# Patient Record
Sex: Female | Born: 1980
Health system: Southern US, Community
[De-identification: ages and names within clinical notes are randomized; demographics above are authoritative.]

## PROBLEM LIST (undated history)

## (undated) DIAGNOSIS — N979 Female infertility, unspecified: Secondary | ICD-10-CM

## (undated) DIAGNOSIS — R1110 Vomiting, unspecified: Secondary | ICD-10-CM

## (undated) DIAGNOSIS — R51 Headache: Secondary | ICD-10-CM

## (undated) HISTORY — DX: Female infertility, unspecified: N97.9

## (undated) HISTORY — DX: Headache: R51

## (undated) HISTORY — DX: Vomiting, unspecified: R11.10

## (undated) HISTORY — PX: WISDOM TOOTH EXTRACTION: SHX21

---

## 2004-02-05 ENCOUNTER — Other Ambulatory Visit: Admission: RE | Admit: 2004-02-05 | Discharge: 2004-02-05 | Payer: Self-pay | Admitting: Obstetrics and Gynecology

## 2004-05-02 ENCOUNTER — Emergency Department (HOSPITAL_COMMUNITY): Admission: EM | Admit: 2004-05-02 | Discharge: 2004-05-02 | Payer: Self-pay | Admitting: Family Medicine

## 2005-01-11 ENCOUNTER — Encounter: Admission: RE | Admit: 2005-01-11 | Discharge: 2005-01-11 | Payer: Self-pay | Admitting: Family Medicine

## 2007-03-28 ENCOUNTER — Emergency Department (HOSPITAL_COMMUNITY): Admission: EM | Admit: 2007-03-28 | Discharge: 2007-03-28 | Payer: Self-pay | Admitting: Emergency Medicine

## 2007-06-19 ENCOUNTER — Inpatient Hospital Stay (HOSPITAL_COMMUNITY): Admission: AD | Admit: 2007-06-19 | Discharge: 2007-06-22 | Payer: Self-pay | Admitting: Obstetrics and Gynecology

## 2007-06-28 ENCOUNTER — Ambulatory Visit: Payer: Self-pay | Admitting: Gastroenterology

## 2007-08-16 ENCOUNTER — Inpatient Hospital Stay (HOSPITAL_COMMUNITY): Admission: AD | Admit: 2007-08-16 | Discharge: 2007-08-16 | Payer: Self-pay | Admitting: Obstetrics and Gynecology

## 2007-12-23 ENCOUNTER — Inpatient Hospital Stay (HOSPITAL_COMMUNITY): Admission: AD | Admit: 2007-12-23 | Discharge: 2007-12-23 | Payer: Self-pay | Admitting: Obstetrics and Gynecology

## 2007-12-25 ENCOUNTER — Inpatient Hospital Stay (HOSPITAL_COMMUNITY): Admission: AD | Admit: 2007-12-25 | Discharge: 2007-12-27 | Payer: Self-pay | Admitting: Obstetrics and Gynecology

## 2008-10-28 IMAGING — US US ABDOMEN COMPLETE
1 series · 14 of 25 positions shown · non-contrast
Comparison: None

CLINICAL DATA: Severe nausea and vomiting.  Abdominal pain.  12
weeks pregnant..

COMPLETE ABDOMINAL ULTRASOUND
TECHNIQUE: Complete abdominal ultrasound examination was performed
including evaluation of the liver, gallbladder, bile ducts,
pancreas, kidneys, spleen, IVC, and abdominal aorta.

[Series 1: us abdomen complete · 0.23mm/px · 14 of 66 slices shown]
[im 1/66]
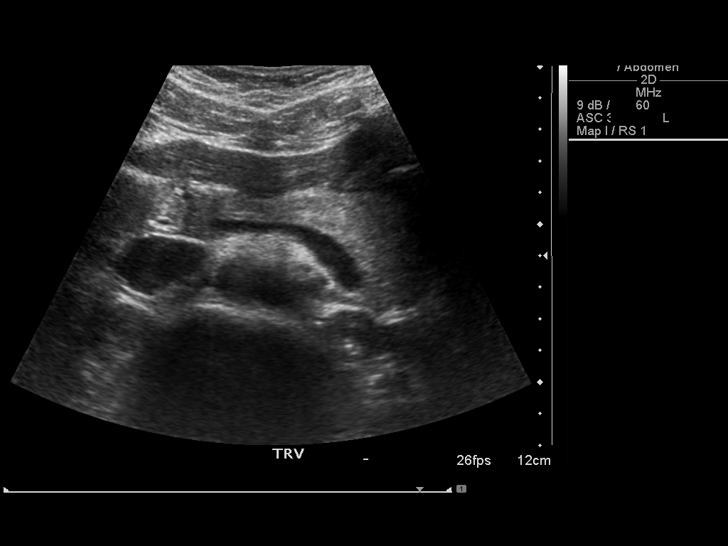
[im 6/66]
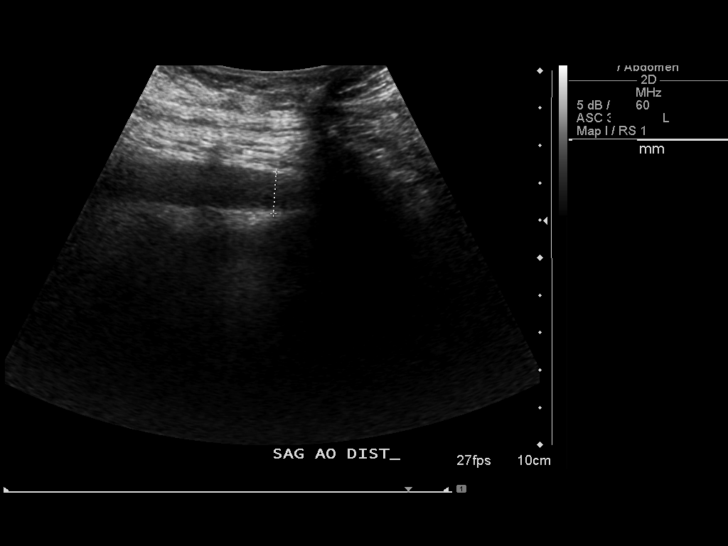
[im 11/66]
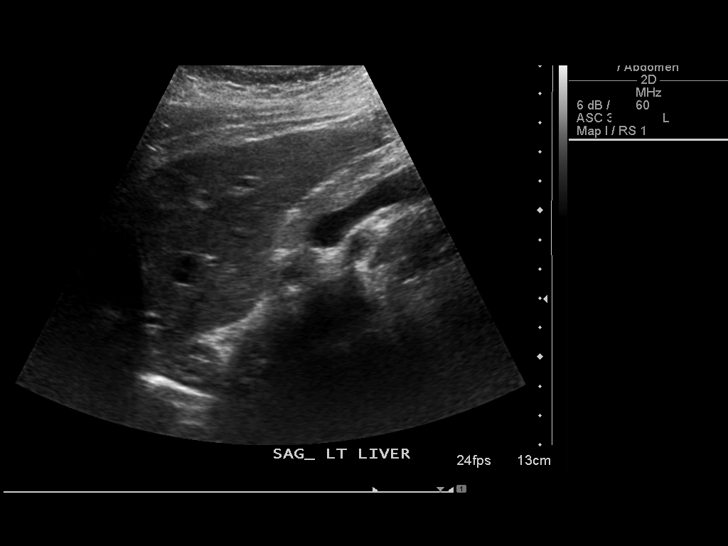
[im 17/66]
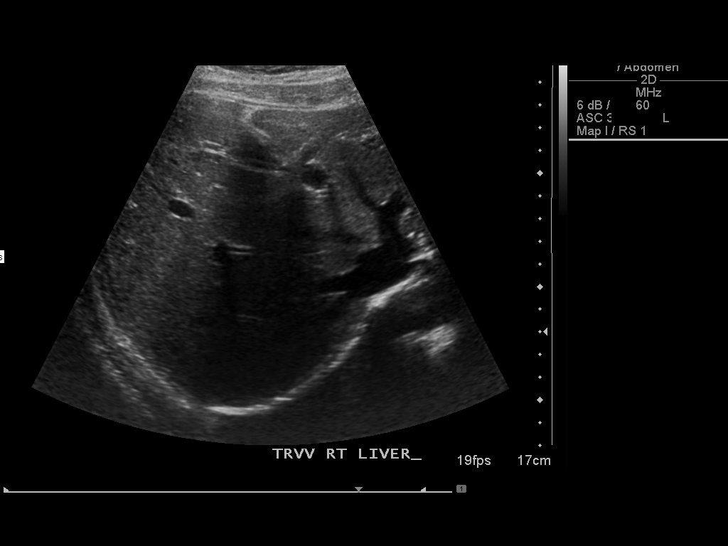
[im 22/66]
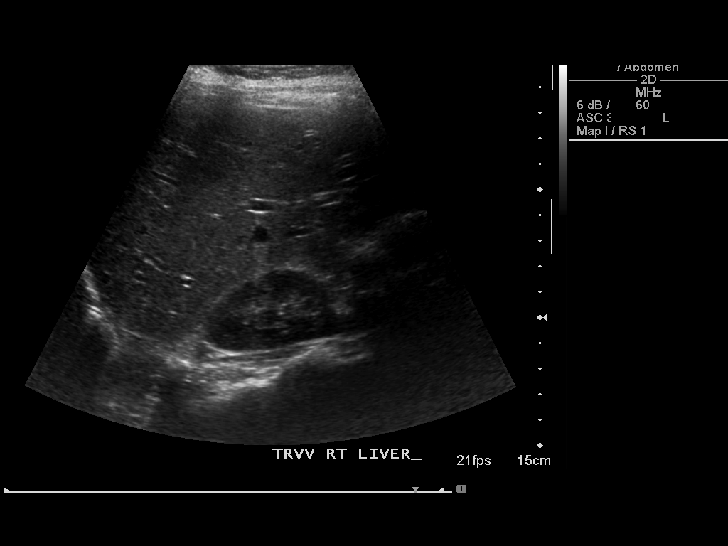
[im 25/66]
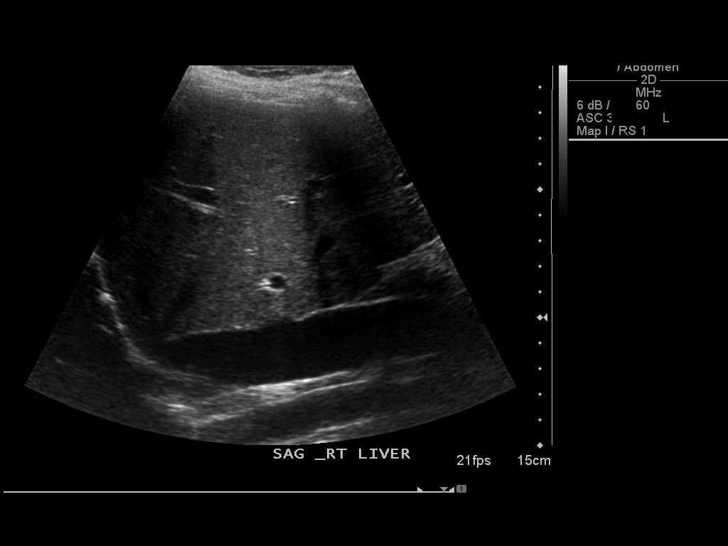
[im 30/66]
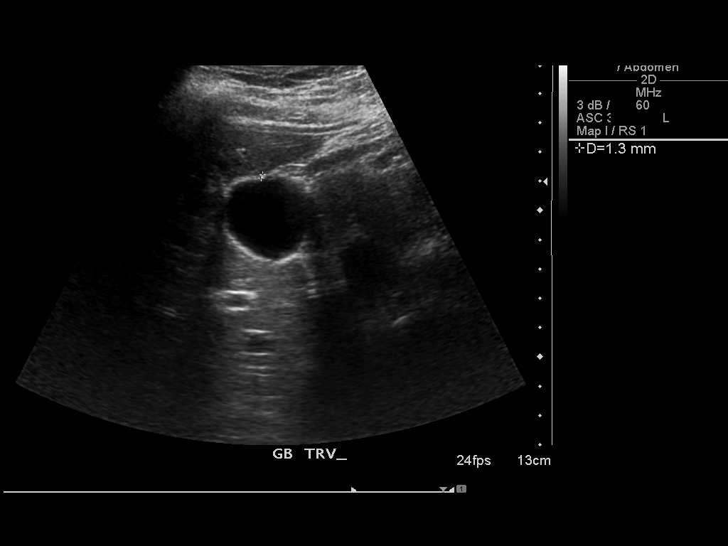
[im 36/66]
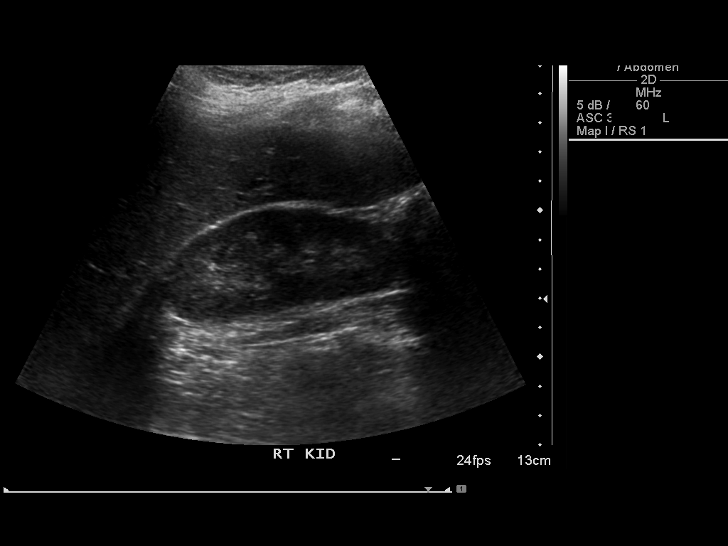
[im 41/66]
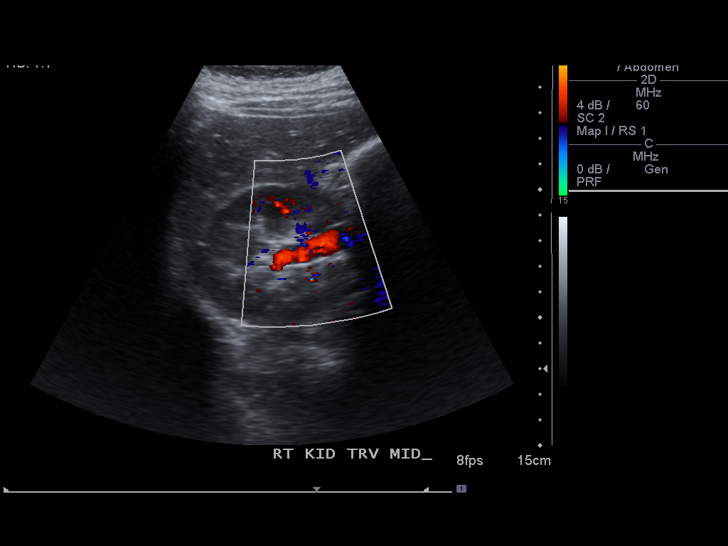
[im 44/66]
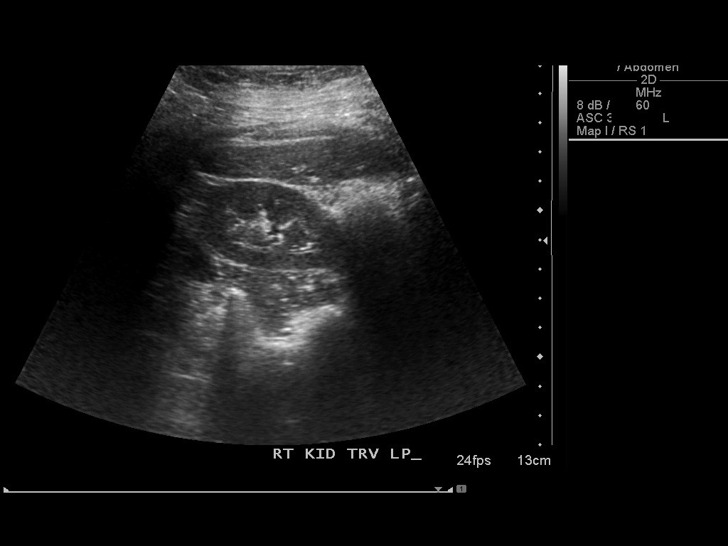
[im 49/66]
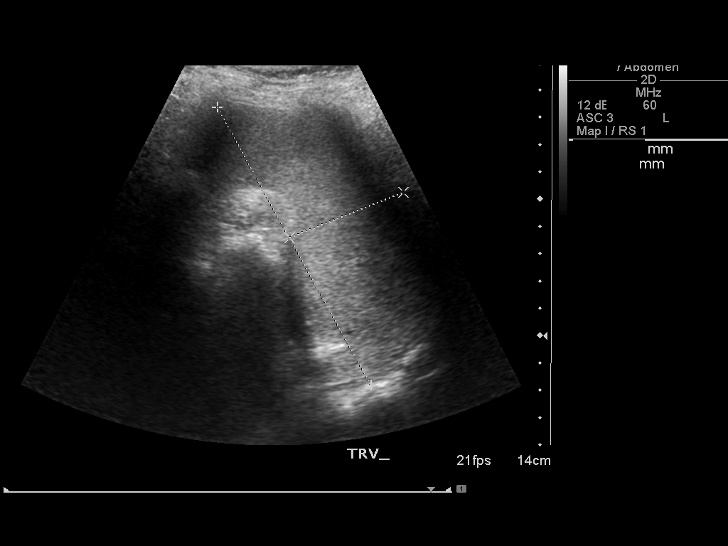
[im 55/66]
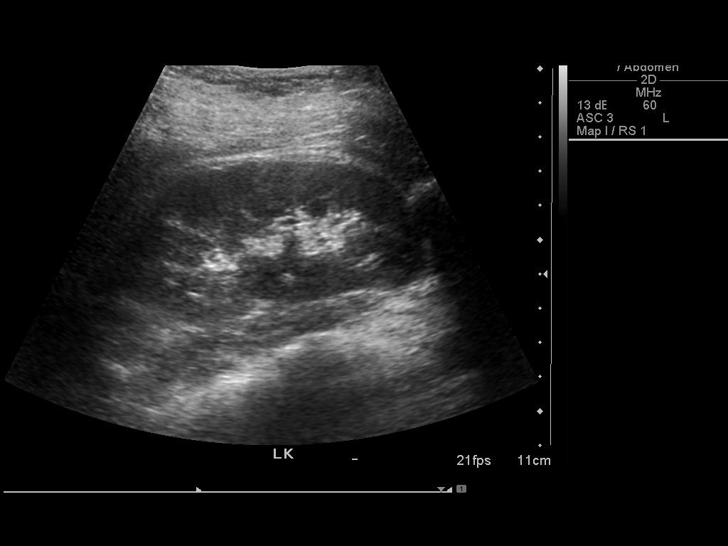
[im 60/66]
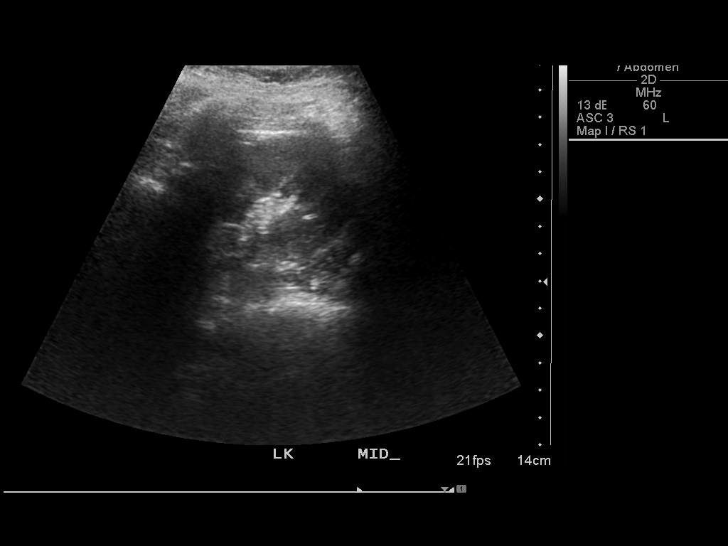
[im 66/66]
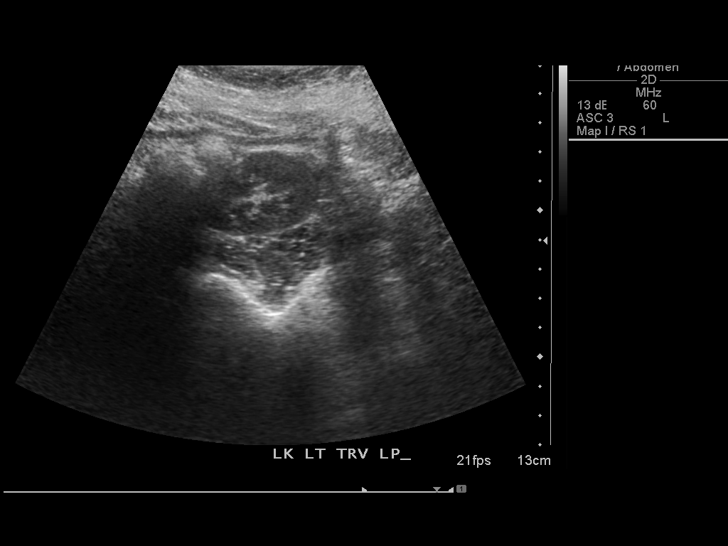

[14 of 25 positions shown; findings below may reference images not displayed]

FINDINGS: Gallbladder:  No gallstones, gallbladder wall thickening, or
pericholecystic fluid.

Common bile duct: Within normal limits in caliber.

Liver:  No focal parenchymal abnormalities.  Within normal limits
in parenchymal echogenicity.

Inferior vena cava:  Visualized portion unremarkable.

Pancreas:  Visualized portion unremarkable.

Spleen:  Within normal limits in size and echogenicity.

Right kidney:  Within normal limits in size and echogenicity. No
evidence of mass or hydronephrosis.

Left kidney:  Within normal limits in size and echogenicity. No
evidence of mass or hydronephrosis.

Abdominal aorta:  Within normal limits in caliber.
IMPRESSION: Negative abdominal ultrasound.

## 2010-06-22 NOTE — Discharge Summary (Signed)
NAME:  Carolyn Cervantes, Carolyn Cervantes               ACCOUNT NO.:  [id number]   MEDICAL RECORD NO.:  [id number]          PATIENT TYPE:  INP   LOCATION:  9309                          FACILITY:  [facility]   PHYSICIAN:  Juluis Mire, M.D.   DATE OF BIRTH:  05-25-1980   DATE OF ADMISSION:  06/19/2007  DATE OF DISCHARGE:  06/22/2007                               DISCHARGE SUMMARY   ADMITTING DIAGNOSIS:  Intrauterine pregnancy at 12 weeks with  hyperemesis.   DISCHARGE DIAGNOSIS:  Intrauterine pregnancy at 12 weeks with  hyperemesis.   For complete history and physical, please see written note.   COURSE IN THE HOSPITAL:  The patient was brought and began IV hydration  and antiemetics.  Because of the persistent nausea and vomiting, an  ultrasound of the gallbladder was obtained.  There was no evidence of  any cholelithiasis.  She did have an elevated amylase.  A GI consult was  obtained.  They thought the amylase was just from the hyperemesis.  She  was finally switched to a Zofran pump, did extremely well with that.  At  the time of discharge, she was tolerated the diet and having minimal  nausea or vomiting at that point in time.  Additionally, she had  undergone a course of steroids for the hyperemesis.  At the time of  discharge, she was afebrile with stable vital signs.  Abdomen was soft  and nontender.  The patient is discharged to home in stable condition.   DISPOSITION:  She will continue on Zofran pump at home.  She will follow  up in the office next week for reevaluation.  She will call for  increasing nausea and vomiting.      Juluis Mire, M.D.  Electronically Signed     JSM/MEDQ  D:  06/22/2007  T:  06/22/2007  Job:  [id number]

## 2010-10-29 LAB — CBC
HCT: 36.2
Hemoglobin: 12.4
MCHC: 34.4
MCV: 87.2
Platelets: 141 — ABNORMAL LOW
RBC: 4.15
RDW: 13.3
WBC: 4.5

## 2010-10-29 LAB — DIFFERENTIAL
Basophils Absolute: 0
Basophils Relative: 0
Eosinophils Absolute: 0.1
Eosinophils Relative: 2
Lymphocytes Relative: 26
Lymphs Abs: 1.2
Monocytes Absolute: 0.5
Monocytes Relative: 12
Neutro Abs: 2.7
Neutrophils Relative %: 60

## 2010-10-29 LAB — HEPATIC FUNCTION PANEL
ALT: 14
AST: 18
Albumin: 3.9
Alkaline Phosphatase: 38 — ABNORMAL LOW
Bilirubin, Direct: 0.1
Indirect Bilirubin: 0.6
Total Bilirubin: 0.7
Total Protein: 6.5

## 2010-10-29 LAB — POCT PREGNANCY, URINE: Preg Test, Ur: NEGATIVE

## 2010-11-09 LAB — CBC
HCT: 28.5 — ABNORMAL LOW
Hemoglobin: 7.6 — CL
MCV: 83
Platelets: 188
Platelets: 213
RBC: 2.77 — ABNORMAL LOW
RDW: 14.6
RDW: 14.8
WBC: 8.6

## 2011-05-12 LAB — OB RESULTS CONSOLE ANTIBODY SCREEN: Antibody Screen: NEGATIVE

## 2011-05-12 LAB — OB RESULTS CONSOLE RUBELLA ANTIBODY, IGM: Rubella: IMMUNE

## 2011-05-12 LAB — OB RESULTS CONSOLE GC/CHLAMYDIA
Chlamydia: NEGATIVE
Gonorrhea: NEGATIVE

## 2011-05-12 LAB — OB RESULTS CONSOLE HIV ANTIBODY (ROUTINE TESTING): HIV: NONREACTIVE

## 2011-05-12 LAB — OB RESULTS CONSOLE RPR: RPR: NONREACTIVE

## 2011-12-09 ENCOUNTER — Inpatient Hospital Stay (HOSPITAL_COMMUNITY): Admission: AD | Admit: 2011-12-09 | Payer: Self-pay | Source: Ambulatory Visit | Admitting: Obstetrics and Gynecology

## 2011-12-15 ENCOUNTER — Encounter (HOSPITAL_COMMUNITY): Payer: Self-pay | Admitting: *Deleted

## 2011-12-15 ENCOUNTER — Telehealth (HOSPITAL_COMMUNITY): Payer: Self-pay | Admitting: *Deleted

## 2011-12-15 NOTE — Telephone Encounter (Signed)
Preadmission screen Pt stated she has several quarter size spots on her pad. Membranes stripped yesterday.  Told pt to notify MD office.  Reports good fetal movement no leaking of fluid and no contractions at present.  Spoke with Amy RN at [facility] and updated to phone conversation.

## 2011-12-15 NOTE — Telephone Encounter (Signed)
Preadmission screen  

## 2011-12-19 ENCOUNTER — Inpatient Hospital Stay (HOSPITAL_COMMUNITY)
Admission: RE | Admit: 2011-12-19 | Discharge: 2011-12-21 | DRG: 775 | Disposition: A | Discharge status: Home / Self Care | Payer: Managed Care, Other (non HMO) | Source: Ambulatory Visit | Attending: Obstetrics and Gynecology | Admitting: Obstetrics and Gynecology

## 2011-12-19 ENCOUNTER — Inpatient Hospital Stay (HOSPITAL_COMMUNITY): Payer: Managed Care, Other (non HMO) | Admitting: Anesthesiology

## 2011-12-19 ENCOUNTER — Encounter (HOSPITAL_COMMUNITY): Payer: Self-pay

## 2011-12-19 ENCOUNTER — Encounter (HOSPITAL_COMMUNITY): Payer: Self-pay | Admitting: Anesthesiology

## 2011-12-19 DIAGNOSIS — R51 Headache: Secondary | ICD-10-CM | POA: Diagnosis not present

## 2011-12-19 DIAGNOSIS — O99893 Other specified diseases and conditions complicating puerperium: Secondary | ICD-10-CM | POA: Diagnosis not present

## 2011-12-19 LAB — CBC
HCT: 34.4 % — ABNORMAL LOW (ref 36.0–46.0)
Hemoglobin: 11.8 g/dL — ABNORMAL LOW (ref 12.0–15.0)
MCHC: 34.3 g/dL (ref 30.0–36.0)
RBC: 3.84 MIL/uL — ABNORMAL LOW (ref 3.87–5.11)

## 2011-12-19 LAB — RPR: RPR Ser Ql: NONREACTIVE

## 2011-12-19 MED ORDER — ONDANSETRON HCL 4 MG PO TABS: 4.0000 mg | ORAL_TABLET | ORAL | Status: DC | PRN

## 2011-12-19 MED ORDER — MEASLES, MUMPS & RUBELLA VAC ~~LOC~~ INJ: 0.5000 mL | INJECTION | Freq: Once | SUBCUTANEOUS | Status: DC

## 2011-12-19 MED ORDER — LACTATED RINGERS IV SOLN
500.0000 mL | INTRAVENOUS | Status: DC | PRN
  Administered 2011-12-19: 500 mL via INTRAVENOUS

## 2011-12-19 MED ORDER — PRENATAL MULTIVITAMIN CH
1.0000 | ORAL_TABLET | Freq: Every day | ORAL | Status: DC
  Administered 2011-12-20 – 2011-12-21 (×2): 1 via ORAL
  Filled 2011-12-19 (×2): qty 1

## 2011-12-19 MED ORDER — SIMETHICONE 80 MG PO CHEW: 80.0000 mg | CHEWABLE_TABLET | ORAL | Status: DC | PRN

## 2011-12-19 MED ORDER — LACTATED RINGERS IV SOLN
INTRAVENOUS | Status: DC
  Administered 2011-12-19 (×2): via INTRAVENOUS

## 2011-12-19 MED ORDER — LACTATED RINGERS IV SOLN
500.0000 mL | Freq: Once | INTRAVENOUS | Status: AC
  Administered 2011-12-19: 500 mL via INTRAVENOUS

## 2011-12-19 MED ORDER — LIDOCAINE HCL (PF) 1 % IJ SOLN
30.0000 mL | INTRAMUSCULAR | Status: DC | PRN
  Filled 2011-12-19: qty 30

## 2011-12-19 MED ORDER — ONDANSETRON HCL 4 MG/2ML IJ SOLN
4.0000 mg | Freq: Four times a day (QID) | INTRAMUSCULAR | Status: DC | PRN
  Administered 2011-12-19: 4 mg via INTRAVENOUS
  Filled 2011-12-19: qty 2

## 2011-12-19 MED ORDER — IBUPROFEN 600 MG PO TABS
600.0000 mg | ORAL_TABLET | Freq: Four times a day (QID) | ORAL | Status: DC
  Administered 2011-12-20 – 2011-12-21 (×6): 600 mg via ORAL
  Filled 2011-12-19 (×7): qty 1

## 2011-12-19 MED ORDER — OXYTOCIN BOLUS FROM INFUSION: 500.0000 mL | INTRAVENOUS | Status: DC

## 2011-12-19 MED ORDER — DIPHENHYDRAMINE HCL 25 MG PO CAPS: 25.0000 mg | ORAL_CAPSULE | Freq: Four times a day (QID) | ORAL | Status: DC | PRN

## 2011-12-19 MED ORDER — DIBUCAINE 1 % RE OINT: 1.0000 "application " | TOPICAL_OINTMENT | RECTAL | Status: DC | PRN

## 2011-12-19 MED ORDER — TERBUTALINE SULFATE 1 MG/ML IJ SOLN: 0.2500 mg | Freq: Once | INTRAMUSCULAR | Status: DC | PRN

## 2011-12-19 MED ORDER — FENTANYL 2.5 MCG/ML BUPIVACAINE 1/10 % EPIDURAL INFUSION (WH - ANES)
14.0000 mL/h | INTRAMUSCULAR | Status: DC
  Administered 2011-12-19: 14 mL/h via EPIDURAL
  Filled 2011-12-19: qty 125

## 2011-12-19 MED ORDER — ACETAMINOPHEN 325 MG PO TABS: 650.0000 mg | ORAL_TABLET | ORAL | Status: DC | PRN

## 2011-12-19 MED ORDER — WITCH HAZEL-GLYCERIN EX PADS: 1.0000 "application " | MEDICATED_PAD | CUTANEOUS | Status: DC | PRN

## 2011-12-19 MED ORDER — BENZOCAINE-MENTHOL 20-0.5 % EX AERO
1.0000 "application " | INHALATION_SPRAY | CUTANEOUS | Status: DC | PRN
  Filled 2011-12-19: qty 56

## 2011-12-19 MED ORDER — PHENYLEPHRINE 40 MCG/ML (10ML) SYRINGE FOR IV PUSH (FOR BLOOD PRESSURE SUPPORT)
80.0000 ug | PREFILLED_SYRINGE | INTRAVENOUS | Status: DC | PRN
  Filled 2011-12-19: qty 5

## 2011-12-19 MED ORDER — LANOLIN HYDROUS EX OINT: TOPICAL_OINTMENT | CUTANEOUS | Status: DC | PRN

## 2011-12-19 MED ORDER — MEDROXYPROGESTERONE ACETATE 150 MG/ML IM SUSP: 150.0000 mg | INTRAMUSCULAR | Status: DC | PRN

## 2011-12-19 MED ORDER — DIPHENHYDRAMINE HCL 50 MG/ML IJ SOLN: 12.5000 mg | INTRAMUSCULAR | Status: DC | PRN

## 2011-12-19 MED ORDER — EPHEDRINE 5 MG/ML INJ
10.0000 mg | INTRAVENOUS | Status: DC | PRN
  Filled 2011-12-19: qty 4

## 2011-12-19 MED ORDER — EPHEDRINE 5 MG/ML INJ: 10.0000 mg | INTRAVENOUS | Status: DC | PRN

## 2011-12-19 MED ORDER — LIDOCAINE HCL (PF) 1 % IJ SOLN
INTRAMUSCULAR | Status: DC | PRN
  Administered 2011-12-19 (×2): 5 mL

## 2011-12-19 MED ORDER — IBUPROFEN 600 MG PO TABS: 600.0000 mg | ORAL_TABLET | Freq: Four times a day (QID) | ORAL | Status: DC | PRN

## 2011-12-19 MED ORDER — PHENYLEPHRINE 40 MCG/ML (10ML) SYRINGE FOR IV PUSH (FOR BLOOD PRESSURE SUPPORT): 80.0000 ug | PREFILLED_SYRINGE | INTRAVENOUS | Status: DC | PRN

## 2011-12-19 MED ORDER — SENNOSIDES-DOCUSATE SODIUM 8.6-50 MG PO TABS
2.0000 | ORAL_TABLET | Freq: Every day | ORAL | Status: DC
  Administered 2011-12-20: 2 via ORAL

## 2011-12-19 MED ORDER — TETANUS-DIPHTH-ACELL PERTUSSIS 5-2.5-18.5 LF-MCG/0.5 IM SUSP: 0.5000 mL | Freq: Once | INTRAMUSCULAR | Status: DC

## 2011-12-19 MED ORDER — OXYTOCIN 40 UNITS IN LACTATED RINGERS INFUSION - SIMPLE MED
1.0000 m[IU]/min | INTRAVENOUS | Status: DC
  Administered 2011-12-19: 10 m[IU]/min via INTRAVENOUS
  Administered 2011-12-19: 2 m[IU]/min via INTRAVENOUS
  Filled 2011-12-19: qty 1000

## 2011-12-19 MED ORDER — OXYTOCIN 40 UNITS IN LACTATED RINGERS INFUSION - SIMPLE MED
62.5000 mL/h | INTRAVENOUS | Status: DC
  Administered 2011-12-19: 62.5 mL/h via INTRAVENOUS

## 2011-12-19 MED ORDER — CITRIC ACID-SODIUM CITRATE 334-500 MG/5ML PO SOLN: 30.0000 mL | ORAL | Status: DC | PRN

## 2011-12-19 MED ORDER — ONDANSETRON HCL 4 MG/2ML IJ SOLN: 4.0000 mg | INTRAMUSCULAR | Status: DC | PRN

## 2011-12-19 NOTE — Progress Notes (Signed)
Pt. Continues to talk to staff and color not as pale. PO fluids taken

## 2011-12-19 NOTE — Anesthesia Procedure Notes (Signed)
Epidural Patient location during procedure: OB Start time: 12/19/2011 12:29 PM  Staffing Anesthesiologist: Brayton Caves R Performed by: anesthesiologist   Preanesthetic Checklist Completed: patient identified, site marked, surgical consent, pre-op evaluation, timeout performed, IV checked, risks and benefits discussed and monitors and equipment checked  Epidural Patient position: sitting Prep: site prepped and draped and DuraPrep Patient monitoring: continuous pulse ox and blood pressure Approach: midline Injection technique: LOR air and LOR saline  Needle:  Needle type: Tuohy  Needle gauge: 17 G Needle length: 9 cm and 9 Needle insertion depth: 5 cm cm Catheter type: closed end flexible Catheter size: 19 Gauge Catheter at skin depth: 10 cm Test dose: negative  Assessment Events: blood not aspirated, injection not painful, no injection resistance, negative IV test and no paresthesia  Additional Notes Patient identified.  Risk benefits discussed including failed block, incomplete pain control, headache, nerve damage, paralysis, blood pressure changes, nausea, vomiting, reactions to medication both toxic or allergic, and postpartum back pain.  Patient expressed understanding and wished to proceed.  All questions were answered.  Sterile technique used throughout procedure and epidural site dressed with sterile barrier dressing. No paresthesia or other complications noted.The patient did not experience any signs of intravascular injection such as tinnitus or metallic taste in mouth nor signs of intrathecal spread such as rapid motor block. Please see nursing notes for vital signs.

## 2011-12-19 NOTE — Progress Notes (Signed)
Pt comfortable w/ epidural.  FHT reassuring Toco Q2 Cvx 4/70/-1  A/P:  Continue w/ Pitocin Exp mngt

## 2011-12-19 NOTE — Progress Notes (Deleted)
Pt back from MRI at [facility].  Reports frontal HA resolved, having some HA in back of head and back stiffness from MRI.  No visual changes or N/V.    AF, VSS  BP 140s/80s Gen - NAD Abd - soft, NT  MR results d/w pt.  No evidence of thrombosis or bleed.  Overall results normal.    A/P:  Plan d/c home with f/u in 1-2 days in office.

## 2011-12-19 NOTE — Progress Notes (Signed)
Pt feeling low pressure and cramping  FHT reassuring Toco Q2 Cvx c/c/+2  A/P:  Will start pushing

## 2011-12-19 NOTE — Anesthesia Preprocedure Evaluation (Signed)
Anesthesia Evaluation  Patient identified by MRN, date of birth, ID band Patient awake    Reviewed: Allergy & Precautions, H&P , Patient's Chart, lab work & pertinent test results  Airway Mallampati: II TM Distance: >3 FB Neck ROM: full    Dental No notable dental hx.    Pulmonary neg pulmonary ROS,  breath sounds clear to auscultation  Pulmonary exam normal       Cardiovascular negative cardio ROS  Rhythm:regular Rate:Normal     Neuro/Psych negative neurological ROS  negative psych ROS   GI/Hepatic negative GI ROS, Neg liver ROS,   Endo/Other  negative endocrine ROS  Renal/GU negative Renal ROS     Musculoskeletal   Abdominal   Peds  Hematology negative hematology ROS (+)   Anesthesia Other Findings Infertility, female   clomid Headache   migraines    Hyperemesis          Reproductive/Obstetrics (+) Pregnancy                           Anesthesia Physical Anesthesia Plan  ASA: II  Anesthesia Plan: Epidural   Post-op Pain Management:    Induction:   Airway Management Planned:   Additional Equipment:   Intra-op Plan:   Post-operative Plan:   Informed Consent: I have reviewed the patients History and Physical, chart, labs and discussed the procedure including the risks, benefits and alternatives for the proposed anesthesia with the patient or authorized representative who has indicated his/her understanding and acceptance.     Plan Discussed with:   Anesthesia Plan Comments:         Anesthesia Quick Evaluation

## 2011-12-19 NOTE — Progress Notes (Signed)
SVD of vigerous female infant w/ apgars of 8,9.  Mild right shoulder dystocia (<15sec) resolved with McRoberts. Placenta delivered spontaneous w/ 3VC.   2nd degree lac repaired w/ 3-0 vicryl.  Fundus firm.  EBL 500cc .  Mom and baby stable in LDR, skin to skin

## 2011-12-19 NOTE — H&P (Signed)
31 yo G4P1 @ 39+5 wks presents for elective IOL.  Pt with worsening back pain and pelvic pressure. Counseled by Dr Rana Snare in the office and scheduled for IOL.   No ctx, vb or lof.  Good FM.  Pregnancy uncomplicated.  Past History - see hollister, GBS -   All:  Codeine  AF, VSS  + FHT  Toco - irregular Gen - NAD Abd - gravid, NT Ext - NT, no edema PV 2-3/50/-2 AROM - clear  A/P:  Pt presents for elective IOL Admit Pitocin induction Risks of c-section, hyperstimulation and fetal distress with pitocin discussed, informed consent

## 2011-12-19 NOTE — Progress Notes (Signed)
Assisted up to bathroom with steady. As going to bathroom pt became lightheaded and pale. Pt remains on steady and assisted back to bed with head of bed low. Pt continued to talk to nurse.

## 2011-12-20 LAB — CBC
MCV: 89.5 fL (ref 78.0–100.0)
Platelets: 149 10*3/uL — ABNORMAL LOW (ref 150–400)
RBC: 3.05 MIL/uL — ABNORMAL LOW (ref 3.87–5.11)
RDW: 14 % (ref 11.5–15.5)
WBC: 14.5 10*3/uL — ABNORMAL HIGH (ref 4.0–10.5)

## 2011-12-20 MED ORDER — TRAMADOL HCL 50 MG PO TABS: 50.0000 mg | ORAL_TABLET | Freq: Four times a day (QID) | ORAL | Status: DC | PRN

## 2011-12-20 NOTE — Progress Notes (Signed)
Post Partum Day 1 Subjective: no complaints, up ad lib, voiding, tolerating PO and + flatus. Denies any dizziness this am  Objective: Blood pressure 100/65, pulse 89, temperature 97.9 F (36.6 C), temperature source Oral, resp. rate 18, height 5\' 4"  (1.626 m), weight 81.647 kg (180 lb), last menstrual period 03/16/2011, SpO2 97.00%, unknown if currently breastfeeding.  Physical Exam:  General: alert and cooperative Lochia: appropriate Uterine Fundus: firm Incision: perineum intact DVT Evaluation: No evidence of DVT seen on physical exam. No significant calf/ankle edema.   Basename 12/20/11 0535 12/19/11 0745  HGB 9.4* 11.8*  HCT 27.3* 34.4*    Assessment/Plan: Plan for discharge tomorrow   LOS: 1 day   Mance Vallejo G 12/20/2011, 8:10 AM

## 2011-12-20 NOTE — Anesthesia Postprocedure Evaluation (Signed)
  Anesthesia Post-op Note  Patient: Carolyn Cervantes  Procedure(s) Performed: * No procedures listed *  Patient Location: Mother/Baby  Anesthesia Type:Epidural  Level of Consciousness: awake, alert  and oriented  Airway and Oxygen Therapy: Patient Spontanous Breathing  Post-op Pain: none  Post-op Assessment: Post-op Vital signs reviewed, Patient's Cardiovascular Status Stable, No headache, No backache, No residual numbness and No residual motor weakness  Post-op Vital Signs: Reviewed and stable  Complications: No apparent anesthesia complications

## 2011-12-21 MED ORDER — IBUPROFEN 600 MG PO TABS: 600.0000 mg | ORAL_TABLET | Freq: Four times a day (QID) | ORAL | Status: DC

## 2011-12-21 NOTE — Discharge Summary (Signed)
Obstetric Discharge Summary Reason for Admission: induction of labor Prenatal Procedures: ultrasound Intrapartum Procedures: spontaneous vaginal delivery Postpartum Procedures: none Complications-Operative and Postpartum: 2 degree perineal laceration Hemoglobin  Date Value Range Status  12/20/2011 9.4* 12.0 - 15.0 g/dL Final     DELTA CHECK NOTED     REPEATED TO VERIFY     HCT  Date Value Range Status  12/20/2011 27.3* 36.0 - 46.0 % Final    Physical Exam:  General: alert and cooperative Lochia: appropriate Uterine Fundus: firm Incision: perineum intact DVT Evaluation: No evidence of DVT seen on physical exam. No significant calf/ankle edema.  Discharge Diagnoses: Term Pregnancy-delivered  Discharge Information: Date: 12/21/2011 Activity: pelvic rest Diet: routine Medications: PNV and Ibuprofen Condition: stable Instructions: refer to practice specific booklet Discharge to: home   Newborn Data: Live born female  Birth Weight: 8 lb 7.1 oz (3830 g) APGAR: 8, 9  Home with mother.  Miles Leyda G 12/21/2011, 8:23 AM

## 2011-12-21 NOTE — Progress Notes (Signed)
UR chart review completed.  

## 2013-12-09 ENCOUNTER — Encounter (HOSPITAL_COMMUNITY): Payer: Self-pay

## 2018-01-25 ENCOUNTER — Encounter: Payer: Self-pay | Admitting: Neurology

## 2018-01-25 ENCOUNTER — Ambulatory Visit: Payer: Managed Care, Other (non HMO) | Admitting: Neurology

## 2018-01-25 ENCOUNTER — Encounter

## 2018-01-25 VITALS — BP 100/64 | HR 68 | Ht 64.0 in | Wt 151.0 lb

## 2018-01-25 DIAGNOSIS — G441 Vascular headache, not elsewhere classified: Secondary | ICD-10-CM

## 2018-01-25 DIAGNOSIS — H539 Unspecified visual disturbance: Secondary | ICD-10-CM | POA: Diagnosis not present

## 2018-01-25 DIAGNOSIS — G43709 Chronic migraine without aura, not intractable, without status migrainosus: Secondary | ICD-10-CM

## 2018-01-25 DIAGNOSIS — R51 Headache with orthostatic component, not elsewhere classified: Secondary | ICD-10-CM

## 2018-01-25 DIAGNOSIS — R519 Headache, unspecified: Secondary | ICD-10-CM

## 2018-01-25 MED ORDER — ONDANSETRON 4 MG PO TBDP: 4.0000 mg | ORAL_TABLET | Freq: Three times a day (TID) | ORAL | 3 refills | Status: DC | PRN

## 2018-01-25 MED ORDER — ERENUMAB-AOOE 140 MG/ML ~~LOC~~ SOAJ: 140.0000 mg | SUBCUTANEOUS | 11 refills | Status: DC

## 2018-01-25 NOTE — Patient Instructions (Addendum)
Aimovig monthly Acutely: Sumatriptan and Ondansetron Return to clinic one year Labs MRI brain   Ondansetron oral dissolving tablet What is this medicine? ONDANSETRON (on DAN se tron) is used to treat nausea and vomiting caused by chemotherapy. It is also used to prevent or treat nausea and vomiting after surgery. This medicine may be used for other purposes; ask your health care provider or pharmacist if you have questions. COMMON BRAND NAME(S): Zofran ODT What should I tell my health care provider before I take this medicine? They need to know if you have any of these conditions: -heart disease -history of irregular heartbeat -liver disease -low levels of magnesium or potassium in the blood -an unusual or allergic reaction to ondansetron, granisetron, other medicines, foods, dyes, or preservatives -pregnant or trying to get pregnant -breast-feeding How should I use this medicine? These tablets are made to dissolve in the mouth. Do not try to push the tablet through the foil backing. With dry hands, peel away the foil backing and gently remove the tablet. Place the tablet in the mouth and allow it to dissolve, then swallow. While you may take these tablets with water, it is not necessary to do so. Talk to your pediatrician regarding the use of this medicine in children. Special care may be needed. Overdosage: If you think you have taken too much of this medicine contact a poison control center or emergency room at once. NOTE: This medicine is only for you. Do not share this medicine with others. What if I miss a dose? If you miss a dose, take it as soon as you can. If it is almost time for your next dose, take only that dose. Do not take double or extra doses. What may interact with this medicine? Do not take this medicine with any of the following medications: -apomorphine -certain medicines for fungal infections like fluconazole, itraconazole, ketoconazole, posaconazole,  voriconazole -cisapride -dofetilide -dronedarone -pimozide -thioridazine -ziprasidone This medicine may also interact with the following medications: -carbamazepine -certain medicines for depression, anxiety, or psychotic disturbances -fentanyl -linezolid -MAOIs like Carbex, Eldepryl, Marplan, Nardil, and Parnate -methylene blue (injected into a vein) -other medicines that prolong the QT interval (cause an abnormal heart rhythm) -phenytoin -rifampicin -tramadol This list may not describe all possible interactions. Give your health care provider a list of all the medicines, herbs, non-prescription drugs, or dietary supplements you use. Also tell them if you smoke, drink alcohol, or use illegal drugs. Some items may interact with your medicine. What should I watch for while using this medicine? Check with your doctor or health care professional as soon as you can if you have any sign of an allergic reaction. What side effects may I notice from receiving this medicine? Side effects that you should report to your doctor or health care professional as soon as possible: -allergic reactions like skin rash, itching or hives, swelling of the face, lips, or tongue -breathing problems -confusion -dizziness -fast or irregular heartbeat -feeling faint or lightheaded, falls -fever and chills -loss of balance or coordination -seizures -sweating -swelling of the hands and feet -tightness in the chest -tremors -unusually weak or tired Side effects that usually do not require medical attention (report to your doctor or health care professional if they continue or are bothersome): -constipation or diarrhea -headache This list may not describe all possible side effects. Call your doctor for medical advice about side effects. You may report side effects to FDA at 1-800-FDA-1088. Where should I keep my medicine? Keep out  of the reach of children. Store between 2 and 30 degrees C (36 and 86  degrees F). Throw away any unused medicine after the expiration date. NOTE: This sheet is a summary. It may not cover all possible information. If you have questions about this medicine, talk to your doctor, pharmacist, or health care provider.  2019 Elsevier/Gold Standard (2012-10-31 16:21:52)   Beckie Busing: Patient drug information Access [employer] Online here. Copyright 774 054 4374 [employer] All rights reserved. (For additional information see "Erenumab: Drug information") Brand Names: [place]  Aimovig;  Aimovig (140 MG Dose) [DSC]  Brand Names: [place]  Aimovig  What is this drug used for?   It is used to prevent migraine headaches.  What do I need to tell my doctor BEFORE I take this drug?   If you are allergic to this drug; any part of this drug; or any other drugs, foods, or substances. Tell your doctor about the allergy and what signs you had.   This drug may interact with other drugs or health problems.   Tell your doctor and pharmacist about all of your drugs (prescription or OTC, natural products, vitamins) and health problems. You must check to make sure that it is safe for you to take this drug with all of your drugs and health problems. Do not start, stop, or change the dose of any drug without checking with your doctor.  What are some things I need to know or do while I take this drug?   Tell all of your health care providers that you take this drug. This includes your doctors, nurses, pharmacists, and dentists.   If you have a latex allergy, talk with your doctor.   Tell your doctor if you are pregnant, plan on getting pregnant, or are breast-feeding. You will need to talk about the benefits and risks to you and the baby.  What are some side effects that I need to call my doctor about right away?   WARNING/CAUTION: Even though it may be rare, some people may have very bad and sometimes deadly side effects when taking a drug. Tell your doctor or get medical help right away  if you have any of the following signs or symptoms that may be related to a very bad side effect:   Signs of an allergic reaction, like rash; hives; itching; red, swollen, blistered, or peeling skin with or without fever; wheezing; tightness in the chest or throat; trouble breathing, swallowing, or talking; unusual hoarseness; or swelling of the mouth, face, lips, tongue, or throat.  What are some other side effects of this drug?   All drugs may cause side effects. However, many people have no side effects or only have minor side effects. Call your doctor or get medical help if any of these side effects or any other side effects bother you or do not go away:   Pain, redness, or swelling where the shot was given.   Constipation is common with this drug. In some people, severe constipation led to treatment in a hospital or surgery. Call your doctor right away if you have constipation that is severe or does not go away.   These are not all of the side effects that may occur. If you have questions about side effects, call your doctor. Call your doctor for medical advice about side effects.   You may report side effects to your national health agency.  How is this drug best taken?   Use this drug as ordered by your doctor.  Read all information given to you. Follow all instructions closely.   It is given as a shot into the fatty part of the skin on the top of the thigh, belly area, or upper arm.   If you will be giving yourself the shot, your doctor or nurse will teach you how to give the shot.   If stored in a refrigerator, let this drug come to room temperature before using it. Leave it at room temperature for at least 30 minutes. Do not heat this drug.   Protect from heat and sunlight.   Do not shake.   Do not give into skin within 2 inches of the belly button.   Do not give into skin that is irritated, tender, bruised, red, scaly, hard, scarred, or has stretch marks.   Do not use if the solution is  cloudy, leaking, or has particles.   This drug is colorless to a faint yellow. Do not use if the solution changes color.   Throw away after using. Do not use the device more than 1 time.   Throw away needles in a needle/sharp disposal box. Do not reuse needles or other items. When the box is full, follow all local rules for getting rid of it. Talk with a doctor or pharmacist if you have any questions.  What do I do if I miss a dose?   Take a missed dose as soon as you think about it.   After taking a missed dose, start a new schedule based on when the dose is taken.  How do I store and/or throw out this drug?   Store in a refrigerator. Do not freeze.   Store in the original container to protect from light.   Do not use if it has been frozen.   If you drop this drug on a hard surface, do not use it.   If needed, you may store at room temperature for up to 7 days. Write down the date you take this drug out of the refrigerator. If stored at room temperature and not used within 7 days, throw this drug away.   Do not put this drug back in the refrigerator after it has been stored at room temperature.   Keep all drugs in a safe place. Keep all drugs out of the reach of children and pets.   Throw away unused or expired drugs. Do not flush down a toilet or pour down a drain unless you are told to do so. Check with your pharmacist if you have questions about the best way to throw out drugs. There may be drug take-back programs in your area.  General drug facts   If your symptoms or health problems do not get better or if they become worse, call your doctor.   Do not share your drugs with others and do not take anyone else's drugs.   Some drugs may have another patient information leaflet. If you have any questions about this drug, please talk with your doctor, nurse, pharmacist, or other health care provider.   If you think there has been an overdose, call your poison control center or get medical care  right away. Be ready to tell or show what was taken, how much, and when it happened.

## 2018-01-25 NOTE — Progress Notes (Signed)
[facility]    Provider:  Dr Lucia GaskinsAhern Referring Provider: Philemon KingdomProchnau, Caroline, MD Primary Care Physician:  Carolyn KingdomProchnau, Caroline, MD  CC:  headaches  HPI:  Carolyn Cervantes is a 37 y.o. female here as requested by Dr. Sudie BaileyProchnau for migraines.  Past medical history migraines. Migraines started in HS. She [facility] the [facility]. She did pharm studies in college. Usually on right side beind the temples pounding/ pulsating, throbbing, nausea, light and sound sensitivity, +nausea and recent vomiting, increasing frequency. No med overuse. No aura.  Can last 24-72 hours. 16 migraine days a month. She is also irritable. Topamax side effects. Ongoing for over a year. She has had vision changes. She has significant eye pain as well. Ophthalmology said her eyes were fine. She can wake with headaches, can improve after getting up in the morning.No other focal neurologic deficits, associated symptoms, inciting events or modifiable factors.   Reviewed notes, labs and imaging from outside physicians, which showed:  Failed Topiramate, relpax and other triptans, amitriptyline, nortriptyline, nsaids,tylenol, propranolol  Reviewed notes from Dr. Blanchard ManeProchnow.  Patient reports that with the increase in Topamax initially felt like it helped but it is like the benefit is worn off.  Her eyes have started to hurt.  She went to an eye specialist for checkup and everything was okay.  She has to take the Imitrex about 2 times per week.  If she does not get ahead of the migraine she will have a headache for 2 days.  To get rid of the headache she will take an Imitrex using a pack lay down drink caffeine.  Carolyn Cervantes has a headache in the right temple area.  The headache will often wake her up at like 3 AM.  She does work night shift.  Reviewed exam which was normal.  Never smoker.  Overall very healthy female.  No significant past medical history.  Review of Systems: Patient complains of symptoms per HPI as  well as the following symptoms: Sleepiness, shift work, headaches, eye pain, fatigue. Pertinent negatives and positives per HPI. All others negative.   Social History   Socioeconomic History  . Marital status: Married    Spouse name: Not on file  . Number of children: 2  . Years of education: Not on file  . Highest education level: Associate degree: academic program  Occupational History  . Not on file  Social Needs  . Financial resource strain: Not on file  . Food insecurity:    Worry: Not on file    Inability: Not on file  . Transportation needs:    Medical: Not on file    Non-medical: Not on file  Tobacco Use  . Smoking status: Never Smoker  . Smokeless tobacco: Never Used  Substance and Sexual Activity  . Alcohol use: Never    Frequency: Never  . Drug use: Never  . Sexual activity: Yes    Partners: Male    Comment: husband had vasectomy  Lifestyle  . Physical activity:    Days per week: Not on file    Minutes per session: Not on file  . Stress: Not on file  Relationships  . Social connections:    Talks on phone: Not on file    Gets together: Not on file    Attends religious service: Not on file    Active member of club or organization: Not on file    Attends meetings of clubs or organizations: Not on file    Relationship  status: Not on file  . Intimate partner violence:    Fear of current or ex partner: Not on file    Emotionally abused: Not on file    Physically abused: Not on file    Forced sexual activity: Not on file  Other Topics Concern  . Not on file  Social History Narrative   Lives at home with husband & children   Right handed   Caffeine: coffee twice daily (2 cups)    Family History  Problem Relation Age of Onset  . Cancer Paternal Grandmother        breast  . Hypertension Mother   . Deep vein thrombosis Mother   . Hypertension Father   . CAD Father        CABG  . Migraines Father   . Diabetes Maternal Aunt   . Irritable bowel  syndrome Maternal Aunt   . Diabetes Maternal Uncle   . Lupus Paternal Uncle   . Heart disease Maternal Grandmother   . Diabetes Maternal Grandmother   . Stroke Maternal Grandmother   . Heart disease Maternal Grandfather   . Diabetes Maternal Grandfather   . Heart disease Paternal Grandfather   . Migraines Sister     Past Medical History:  Diagnosis Date  . Headache(784.0)    migraines  . Hyperemesis   . Infertility, female    clomid    Past Surgical History:  Procedure Laterality Date  . WISDOM TOOTH EXTRACTION      Current Outpatient Medications  Medication Sig Dispense Refill  . cetirizine (ZYRTEC) 10 MG tablet Take 10 mg by mouth daily as needed for allergies.    . Ibuprofen-Famotidine (DUEXIS PO) Take 1 tablet by mouth as needed.    . pseudoephedrine (SUDAFED) 120 MG 12 hr tablet Take 120 mg by mouth every 12 (twelve) hours as needed for congestion.    . SUMAtriptan (IMITREX) 100 MG tablet Take 100 mg by mouth every 2 (two) hours as needed for migraine. May repeat in 2 hours if headache persists or recurs.    . topiramate (TOPAMAX) 100 MG tablet Take 200 mg by mouth at bedtime.    [name]. Erenumab-aooe (AIMOVIG) 140 MG/ML SOAJ Inject 140 mg into the skin every 30 (thirty) days. 1 pen 11  . ibuprofen (ADVIL,MOTRIN) 600 MG tablet Take 1 tablet (600 mg total) by mouth every 6 (six) hours. (Patient not taking: Reported on 01/25/2018) 30 tablet 1  . ondansetron (ZOFRAN-ODT) 4 MG disintegrating tablet Take 1 tablet (4 mg total) by mouth every 8 (eight) hours as needed for nausea. 30 tablet 3   No current facility-administered medications for this visit.     Allergies as of 01/25/2018 - Review Complete 01/25/2018  Allergen Reaction Noted  . Codeine Anaphylaxis 12/15/2011    Vitals: BP 100/64 (BP Location: Right Arm, Patient Position: Sitting)   Pulse 68   Ht 5\' 4"  (1.626 m)   Wt 151 lb (68.5 kg)   LMP 01/08/2018 (Exact Date)   Breastfeeding No   BMI 25.92 kg/m  Last  Weight:  Wt Readings from Last 1 Encounters:  01/25/18 151 lb (68.5 kg)   Last Height:   Ht Readings from Last 1 Encounters:  01/25/18 5\' 4"  (1.626 m)    Physical exam: Exam: Gen: NAD, conversant, well nourised, well groomed                     CV: RRR, no MRG. No Carotid Bruits. No peripheral edema, warm,  nontender Eyes: Conjunctivae clear without exudates or hemorrhage  Neuro: Detailed Neurologic Exam  Speech:    Speech is normal; fluent and spontaneous with normal comprehension.  Cognition:    The patient is oriented to person, place, and time;     recent and remote memory intact;     language fluent;     normal attention, concentration,     fund of knowledge Cranial Nerves:    The pupils are equal, round, and reactive to light. The fundi are normal and spontaneous venous pulsations are present. Visual fields are full to finger confrontation. Extraocular movements are intact. Trigeminal sensation is intact and the muscles of mastication are normal. The face is symmetric. The palate elevates in the midline. Hearing intact. Voice is normal. Shoulder shrug is normal. The tongue has normal motion without fasciculations.   Coordination:    Normal finger to nose and heel to shin. Normal rapid alternating movements.   Gait:    Heel-toe and tandem gait are normal.   Motor Observation:    No asymmetry, no atrophy, and no involuntary movements noted. Tone:    Normal muscle tone.    Posture:    Posture is normal. normal erect    Strength:    Strength is V/V in the upper and lower limbs.      Sensation: intact to LT     Reflex Exam:  DTR's:    Deep tendon reflexes in the upper and lower extremities are normal bilaterally.   Toes:    The toes are downgoing bilaterally.   Clonus:    Clonus is absent.      Assessment/Plan:  37 year with likely chronic migraines however needs thorough evaluation  MRI w/wo contrast: MRI brain due to concerning symptoms of morning  headaches, positional headaches,vision changes  to look for space occupying mass, chiari or intracranial hypertension (pseudotumor).  Preventative: Aimovig monthly Acutely: Sumatriptan and Ondansetron Return to clinic one year Labs  Cc: Dr. Sudie Bailey  Meds ordered this encounter  Medications  . Erenumab-aooe (AIMOVIG) 140 MG/ML SOAJ    Sig: Inject 140 mg into the skin every 30 (thirty) days.    Dispense:  1 pen    Refill:  11    patient has a copay card and does not need insurance approval to get this medication.  . ondansetron (ZOFRAN-ODT) 4 MG disintegrating tablet    Sig: Take 1 tablet (4 mg total) by mouth every 8 (eight) hours as needed for nausea.    Dispense:  30 tablet    Refill:  3   Orders Placed This Encounter  Procedures  . MR BRAIN W WO CONTRAST  . TSH  . Basic Metabolic Panel   Discussed: To prevent or relieve headaches, try the following: Cool Compress. Lie down and place a cool compress on your head.  Avoid headache triggers. If certain foods or odors seem to have triggered your migraines in the past, avoid them. A headache diary might help you identify triggers.  Include physical activity in your daily routine. Try a daily walk or other moderate aerobic exercise.  Manage stress. Find healthy ways to cope with the stressors, such as delegating tasks on your to-do list.  Practice relaxation techniques. Try deep breathing, yoga, massage and visualization.  Eat regularly. Eating regularly scheduled meals and maintaining a healthy diet might help prevent headaches. Also, drink plenty of fluids.  Follow a regular sleep schedule. Sleep deprivation might contribute to headaches Consider biofeedback. With this mind-body technique, you  learn to control certain bodily functions - such as muscle tension, heart rate and blood pressure - to prevent headaches or reduce headache pain.    Proceed to emergency room if you experience new or worsening symptoms or symptoms do not  resolve, if you have new neurologic symptoms or if headache is severe, or for any concerning symptom.   Provided education and documentation from American headache Society toolbox including articles on: chronic migraine medication overuse headache, chronic migraines, prevention of migraines, behavioral and other nonpharmacologic treatments for headache.    Naomie Dean, MD  [facility] [address] [place], [place] [address]  Phone [phone] Fax [phone]

## 2018-01-29 ENCOUNTER — Telehealth: Payer: Self-pay | Admitting: *Deleted

## 2018-01-29 NOTE — Telephone Encounter (Addendum)
Aimovig has been denied by pt's insurance. Aimovig is only covered when the patient cannot be switched to Ajovy and Emgality.   If we should choose to appeal this decision, fax appeal to [phone].   Pt should have savings card for 12 months of free Aimovig.

## 2018-01-29 NOTE — Telephone Encounter (Signed)
Called pt & LVM (ok per DPR) informing pt that insurance denied because it prefers Ajovy and [employer]. Advised pt that her insurance may change the preferred next year but as of now we are not sure. However the savings card should allow her to get the medication free for 12 months. Advised pt to activate the card if she has one already or go [email or web address] and request/activate a new card. Left office number and hours in message for call back. Advised pt that a call back is not required.

## 2018-01-29 NOTE — Telephone Encounter (Signed)
PA completed for Aimovig 140 mg on Cover My Meds. KEY: [id number]. If [employer] has not responded to your request within 24 hours, contact [employer] at [phone].

## 2018-01-31 DIAGNOSIS — G43709 Chronic migraine without aura, not intractable, without status migrainosus: Secondary | ICD-10-CM | POA: Insufficient documentation

## 2018-02-05 ENCOUNTER — Telehealth: Payer: Self-pay | Admitting: Neurology

## 2018-02-05 NOTE — Telephone Encounter (Signed)
[employer] order sent to [facility]. They obtain the auth and will reach out to the pt to schedule.  °

## 2018-02-05 NOTE — Telephone Encounter (Signed)
I called and provided the patient with the number to GI, [phone]. DW  °

## 2018-02-17 ENCOUNTER — Other Ambulatory Visit: Payer: Managed Care, Other (non HMO)

## 2018-07-23 ENCOUNTER — Telehealth: Payer: Self-pay | Admitting: Neurology

## 2018-07-23 NOTE — Telephone Encounter (Signed)
Reordered, thanks

## 2018-07-23 NOTE — Telephone Encounter (Signed)
Carolyn Cervantes from [facility] on the line, Carolyn Cervantes is on the line wanting to schedule her MRI but the order is expired and they are wanting to know if you can reissue it.   CB# [phone]

## 2018-07-23 NOTE — Telephone Encounter (Signed)
[employer] order sent to [facility]. They will obtain the auth and reach out to the patient to schedule.  

## 2018-07-23 NOTE — Addendum Note (Signed)
Addended by: Sarina Ill B on: 07/23/2018 12:52 PM   Modules accepted: Orders

## 2018-07-23 NOTE — Telephone Encounter (Signed)
Returned Miriam's call @ [facility] and LVM advising MRI had been reordered by Dr. Jaynee Eagles for the patient. Left cb # if she has any questions.

## 2018-08-14 ENCOUNTER — Telehealth: Payer: Self-pay | Admitting: Neurology

## 2018-08-14 NOTE — Telephone Encounter (Signed)
Carolyn Cervantes from [facility] called requesting the MRI orders to be faxed over to [facility] to [phone]

## 2018-08-14 NOTE — Telephone Encounter (Signed)
Thanks Carolyn Cervantes.

## 2018-08-15 ENCOUNTER — Encounter: Payer: Self-pay | Admitting: Neurology

## 2018-08-15 NOTE — Telephone Encounter (Signed)
Faxed order to [facility].

## 2018-08-15 NOTE — Telephone Encounter (Signed)
Noted, I faxed again.

## 2018-08-15 NOTE — Telephone Encounter (Signed)
Noted waiting on signature.

## 2018-08-15 NOTE — Telephone Encounter (Signed)
Carolyn Cervantes with [name] called and stated they had not received order but she will give it some time to see if they come through.

## 2018-08-16 ENCOUNTER — Other Ambulatory Visit: Payer: Managed Care, Other (non HMO)

## 2018-08-16 NOTE — Telephone Encounter (Signed)
Patient is scheduled at [facility] for 08/17/18.

## 2018-08-20 ENCOUNTER — Telehealth: Payer: Self-pay | Admitting: Neurology

## 2018-08-20 NOTE — Telephone Encounter (Signed)
MRI of the brain is normal. Thank you.

## 2018-08-21 NOTE — Telephone Encounter (Signed)
Pt would like copy of results mailed out to home

## 2018-08-21 NOTE — Telephone Encounter (Signed)
Done copy mailed out today to pt address.

## 2018-11-02 ENCOUNTER — Encounter (HOSPITAL_COMMUNITY): Payer: Managed Care, Other (non HMO)

## 2018-12-20 ENCOUNTER — Encounter: Payer: Self-pay | Admitting: Neurology

## 2019-01-01 ENCOUNTER — Telehealth: Payer: Self-pay | Admitting: *Deleted

## 2019-01-01 NOTE — Telephone Encounter (Signed)
Spoke with patient. Overall Aimovig has been working for her but lately she has had less effectiveness. She wonders if this is related to her cycle. She agreed to see NP for this next f/u and she was scheduled with Amy for Wed 02/20/19 @ 9:00 AM. The understands if her aimovig savings card maxes out she can download a new one from [email or web address]. She verbalized appreciation.   Aimovig 140 mg PA completed on CMM. KEY: [id number]. Awaiting determination from [facility] [employer].

## 2019-01-01 NOTE — Telephone Encounter (Signed)
Received PA for Aimovig. No update on file regarding how it is working for her. I called the pt and LVM (ok per DPR) asking for call back to let us know. I also asked if she would be willing to see the NP for her January 2021 f/u. Left office number in message.   The key for the Aimovig pa we received is [id number]. Will wait to complete until we've heard from the patient.

## 2019-01-07 NOTE — Telephone Encounter (Signed)
Per [facility] [employer], aimovig 140 mg approved 01/02/2019-01/02/2020. I faxed the approval to [facility]. Received a receipt of confirmation.

## 2019-01-28 ENCOUNTER — Other Ambulatory Visit: Payer: Self-pay | Admitting: Neurology

## 2019-01-28 ENCOUNTER — Ambulatory Visit: Payer: Managed Care, Other (non HMO) | Admitting: Neurology

## 2019-01-28 NOTE — Telephone Encounter (Signed)
We received another PA request for the patient. PA was already approved through November 2021. I called [facility] and spoke with Baxter Flattery. PA was prompted d/t early fill attempt. She stated insurance should pay again on 12/23. They will setup for a future fill and will notify us if a PA is actually needed at that time.

## 2019-02-20 ENCOUNTER — Telehealth: Payer: Self-pay | Admitting: *Deleted

## 2019-02-20 ENCOUNTER — Ambulatory Visit: Payer: Managed Care, Other (non HMO) | Admitting: Family Medicine

## 2019-02-20 ENCOUNTER — Encounter: Payer: Self-pay | Admitting: Family Medicine

## 2019-02-20 ENCOUNTER — Other Ambulatory Visit: Payer: Self-pay

## 2019-02-20 VITALS — BP 103/66 | HR 55 | Temp 97.4°F | Ht 64.0 in | Wt 151.6 lb

## 2019-02-20 DIAGNOSIS — G43109 Migraine with aura, not intractable, without status migrainosus: Secondary | ICD-10-CM

## 2019-02-20 MED ORDER — EMGALITY 120 MG/ML ~~LOC~~ SOAJ: 120.0000 mg | SUBCUTANEOUS | 11 refills | Status: DC

## 2019-02-20 NOTE — Telephone Encounter (Addendum)
Started [place] PA on [facility], key: [id number], dx: G43.109, failed: topiramate, relpax, amitriptyline, nortriptyline, propranolol, NSAIDS, tylenol. Contact plan to follow up on [id number].   You will be notified of the determination electronically and via fax.

## 2019-02-20 NOTE — Patient Instructions (Signed)
We will stop Amovig. Start Emgality as directed.   Follow up in 6 months, if Emgality works well may follow in 1 year   Migraine Headache A migraine headache is a very strong throbbing pain on one side or both sides of your head. This type of headache can also cause other symptoms. It can last from 4 hours to 3 days. Talk with your doctor about what things may bring on (trigger) this condition. What are the causes? The exact cause of this condition is not known. This condition may be triggered or caused by:  Drinking alcohol.  Smoking.  Taking medicines, such as: ? Medicine used to treat chest pain (nitroglycerin). ? Birth control pills. ? Estrogen. ? Some blood pressure medicines.  Eating or drinking certain products.  Doing physical activity. Other things that may trigger a migraine headache include:  Having a menstrual period.  Pregnancy.  Hunger.  Stress.  Not getting enough sleep or getting too much sleep.  Weather changes.  Tiredness (fatigue). What increases the risk?  Being 70-65 years old.  Being female.  Having a family history of migraine headaches.  Being Caucasian.  Having depression or anxiety.  Being very overweight. What are the signs or symptoms?  A throbbing pain. This pain may: ? Happen in any area of the head, such as on one side or both sides. ? Make it hard to do daily activities. ? Get worse with physical activity. ? Get worse around bright lights or loud noises.  Other symptoms may include: ? Feeling sick to your stomach (nauseous). ? Vomiting. ? Dizziness. ? Being sensitive to bright lights, loud noises, or smells.  Before you get a migraine headache, you may get warning signs (an aura). An aura may include: ? Seeing flashing lights or having blind spots. ? Seeing bright spots, halos, or zigzag lines. ? Having tunnel vision or blurred vision. ? Having numbness or a tingling feeling. ? Having trouble talking. ? Having  weak muscles.  Some people have symptoms after a migraine headache (postdromal phase), such as: ? Tiredness. ? Trouble thinking (concentrating). How is this treated?  Taking medicines that: ? Relieve pain. ? Relieve the feeling of being sick to your stomach. ? Prevent migraine headaches.  Treatment may also include: ? Having acupuncture. ? Avoiding foods that bring on migraine headaches. ? Learning ways to control your body functions (biofeedback). ? Therapy to help you know and deal with negative thoughts (cognitive behavioral therapy). Follow these instructions at home: Medicines  Take over-the-counter and prescription medicines only as told by your doctor.  Ask your doctor if the medicine prescribed to you: ? Requires you to avoid driving or using heavy machinery. ? Can cause trouble pooping (constipation). You may need to take these steps to prevent or treat trouble pooping:  Drink enough fluid to keep your pee (urine) pale yellow.  Take over-the-counter or prescription medicines.  Eat foods that are high in fiber. These include beans, whole grains, and fresh fruits and vegetables.  Limit foods that are high in fat and sugar. These include fried or sweet foods. Lifestyle  Do not drink alcohol.  Do not use any products that contain nicotine or tobacco, such as cigarettes, e-cigarettes, and chewing tobacco. If you need help quitting, ask your doctor.  Get at least 8 hours of sleep every night.  Limit and deal with stress. General instructions      Keep a journal to find out what may bring on your migraine headaches. For  example, write down: ? What you eat and drink. ? How much sleep you get. ? Any change in what you eat or drink. ? Any change in your medicines.  If you have a migraine headache: ? Avoid things that make your symptoms worse, such as bright lights. ? It may help to lie down in a dark, quiet room. ? Do not drive or use heavy machinery. ? Ask  your doctor what activities are safe for you.  Keep all follow-up visits as told by your doctor. This is important. Contact a doctor if:  You get a migraine headache that is different or worse than others you have had.  You have more than 15 headache days in one month. Get help right away if:  Your migraine headache gets very bad.  Your migraine headache lasts longer than 72 hours.  You have a fever.  You have a stiff neck.  You have trouble seeing.  Your muscles feel weak or like you cannot control them.  You start to lose your balance a lot.  You start to have trouble walking.  You pass out (faint).  You have a seizure. Summary  A migraine headache is a very strong throbbing pain on one side or both sides of your head. These headaches can also cause other symptoms.  This condition may be treated with medicines and changes to your lifestyle.  Keep a journal to find out what may bring on your migraine headaches.  Contact a doctor if you get a migraine headache that is different or worse than others you have had.  Contact your doctor if you have more than 15 headache days in a month. This information is not intended to replace advice given to you by your health care provider. Make sure you discuss any questions you have with your health care provider. Document Revised: 05/18/2018 Document Reviewed: 03/08/2018 Elsevier Patient Education  [employer].

## 2019-02-20 NOTE — Progress Notes (Addendum)
PATIENT: Carolyn Cervantes DOB: May 24, 1980  REASON FOR VISIT: follow up HISTORY FROM: patient  Chief Complaint  Patient presents with  . Follow-up    New rm alone. No changes would like to discuss medication dosages.     HISTORY OF PRESENT ILLNESS: Today 02/20/19 Carolyn Cervantes is a 39 y.o. female here today for follow up for migraines. She was seen by Dr Jaynee Eagles last in 01/2018. She was started on Amovig. She tried to taper topiramate dose but headaches worsened. She continues topiramate 100mg  at bedtime. She reports doing very well in the first half of the year. Over the past few months, she feels that headaches are worsening. They are no were as bad as they were but seem to be occurring more frequently. She may have 2-5 migrainous 4:00 in the morning and at days per month. Usually unilateral sharp shooting pain. Sometimes pounding. Her eye will tear up. Rest and sleep help. She always has a bad migraine at the start of her menstrual cycle. She has noted some flashing lights before certain migraines. Imitrex does help. She was advised about a month ago by dermatology to stop ibuprofen just to be safe due to idiopathic urticaria. She is treated for seasonal allergies with daily cetirizine and pseudoephedrine as needed.   Failed Topiramate, relpax and other triptans, amitriptyline, nortriptyline, nsaids,tylenol, propranolol  HISTORY: (copied from Dr Cathren Laine note on 01/25/2018)  HPI:  Carolyn Cervantes is a 39 y.o. female here as requested by Dr. Laqueta Due for migraines.  Past medical history migraines. Migraines started in HS. [facility]. She did pharm studies in college. Usually on right side beind the temples pounding/ pulsating, throbbing, nausea, light and sound sensitivity, +nausea and recent vomiting, increasing frequency. No med overuse. No aura.  Can last 24-72 hours. 16 migraine days a month. She is also irritable. Topamax side effects. Ongoing for over a  year. She has had vision changes. She has significant eye pain as well. Ophthalmology said her eyes were fine. She can wake with headaches, can improve after getting up in the morning.No other focal neurologic deficits, associated symptoms, inciting events or modifiable factors.   Reviewed notes, labs and imaging from outside physicians, which showed:    Reviewed notes from Dr. Sandrea Hughs.  Patient reports that with the increase in Topamax initially felt like it helped but it is like the benefit is worn off.  Her eyes have started to hurt.  She went to an eye specialist for checkup and everything was okay.  She has to take the Imitrex about 2 times per week.  If she does not get ahead of the migraine she will have a headache for 2 days.  To get rid of the headache she will take an Imitrex using a pack lay down drink caffeine.  Carolyn Cervantes has a headache in the right temple area.  The headache will often wake her up at like 3 AM.  She does work night shift.  Reviewed exam which was normal.  Never smoker.  Overall very healthy female.  No significant past medical history.   REVIEW OF SYSTEMS: Out of a complete 14 system review of symptoms, the patient complains only of the following symptoms, headaches and all other reviewed systems are negative.  ALLERGIES: Allergies  Allergen Reactions  . Codeine Anaphylaxis    Pt reports tolerating ultram in the past.    HOME MEDICATIONS: Outpatient Medications Prior to Visit  Medication Sig Dispense Refill  .  cetirizine (ZYRTEC) 10 MG tablet Take 10 mg by mouth daily as needed for allergies.    Marland Kitchen ondansetron (ZOFRAN-ODT) 4 MG disintegrating tablet Take 1 tablet (4 mg total) by mouth every 8 (eight) hours as needed for nausea. 30 tablet 3  . pseudoephedrine (SUDAFED) 120 MG 12 hr tablet Take 120 mg by mouth every 12 (twelve) hours as needed for congestion.    . SUMAtriptan (IMITREX) 100 MG tablet Take 100 mg by mouth every 2 (two) hours as needed for migraine.  May repeat in 2 hours if headache persists or recurs.    . topiramate (TOPAMAX) 100 MG tablet Take 100 mg by mouth at bedtime.     Marland Kitchen AIMOVIG 140 MG/ML SOAJ Inject 140 mg into the skin every 30 (thirty) days. 1 mL 1  . Ibuprofen-Famotidine (DUEXIS PO) Take 1 tablet by mouth as needed.    Marland Kitchen ibuprofen (ADVIL,MOTRIN) 600 MG tablet Take 1 tablet (600 mg total) by mouth every 6 (six) hours. (Patient not taking: Reported on 01/25/2018) 30 tablet 1   No facility-administered medications prior to visit.    PAST MEDICAL HISTORY: Past Medical History:  Diagnosis Date  . Headache(784.0)    migraines  . Hyperemesis   . Infertility, female    clomid    PAST SURGICAL HISTORY: Past Surgical History:  Procedure Laterality Date  . WISDOM TOOTH EXTRACTION      FAMILY HISTORY: Family History  Problem Relation Age of Onset  . Cancer Paternal Grandmother        breast  . Hypertension Mother   . Deep vein thrombosis Mother   . Hypertension Father   . CAD Father        CABG  . Migraines Father   . Diabetes Maternal Aunt   . Irritable bowel syndrome Maternal Aunt   . Diabetes Maternal Uncle   . Lupus Paternal Uncle   . Heart disease Maternal Grandmother   . Diabetes Maternal Grandmother   . Stroke Maternal Grandmother   . Heart disease Maternal Grandfather   . Diabetes Maternal Grandfather   . Heart disease Paternal Grandfather   . Migraines Sister     SOCIAL HISTORY: Social History   Socioeconomic History  . Marital status: Married    Spouse name: Not on file  . Number of children: 2  . Years of education: Not on file  . Highest education level: Associate degree: academic program  Occupational History  . Not on file  Tobacco Use  . Smoking status: Never Smoker  . Smokeless tobacco: Never Used  Substance and Sexual Activity  . Alcohol use: Never  . Drug use: Never  . Sexual activity: Yes    Partners: Male    Comment: husband had vasectomy  Other Topics Concern  . Not on  file  Social History Narrative   Lives at home with husband & children   Right handed   Caffeine: coffee twice daily (2 cups)   Social Determinants of Health   Financial Resource Strain:   . Difficulty of Paying Living Expenses: Not on file  Food Insecurity:   . Worried About Programme researcher, broadcasting/film/video in the Last Year: Not on file  . Ran Out of Food in the Last Year: Not on file  Transportation Needs:   . Lack of Transportation (Medical): Not on file  . Lack of Transportation (Non-Medical): Not on file  Physical Activity:   . Days of Exercise per Week: Not on file  . Minutes of Exercise  per Session: Not on file  Stress:   . Feeling of Stress : Not on file  Social Connections:   . Frequency of Communication with Friends and Family: Not on file  . Frequency of Social Gatherings with Friends and Family: Not on file  . Attends Religious Services: Not on file  . Active Member of Clubs or Organizations: Not on file  . Attends Banker Meetings: Not on file  . Marital Status: Not on file  Intimate Partner Violence:   . Fear of Current or Ex-Partner: Not on file  . Emotionally Abused: Not on file  . Physically Abused: Not on file  . Sexually Abused: Not on file      PHYSICAL EXAM  Vitals:   02/20/19 0916  BP: 103/66  Pulse: (!) 55  Temp: (!) 97.4 F (36.3 C)  Weight: 151 lb 9.6 oz (68.8 kg)  Height: 5\' 4"  (1.626 m)   Body mass index is 26.02 kg/m.  Generalized: Well developed, in no acute distress  Cardiology: normal rate and rhythm, no murmur noted Respiratory: clear to auscultation bilaterally  Neurological examination  Mentation: Alert oriented to time, place, history taking. Follows all commands speech and language fluent Cranial nerve II-XII: Pupils were equal round reactive to light. Extraocular movements were full, visual field were full on confrontational test. Facial sensation and strength were normal. Uvula tongue midline. Head turning and shoulder  shrug  were normal and symmetric. Motor: The motor testing reveals 5 over 5 strength of all 4 extremities. Good symmetric motor tone is noted throughout.  Sensory: Sensory testing is intact to soft touch on all 4 extremities. No evidence of extinction is noted.  Coordination: Cerebellar testing reveals good finger-nose-finger and heel-to-shin bilaterally.  Gait and station: Gait is normal.    DIAGNOSTIC DATA (LABS, IMAGING, TESTING) - I reviewed patient records, labs, notes, testing and imaging myself where available.  No flowsheet data found.   Lab Results  Component Value Date   WBC 14.5 (H) 12/20/2011   HGB 9.4 (L) 12/20/2011   HCT 27.3 (L) 12/20/2011   MCV 89.5 12/20/2011   PLT 149 (L) 12/20/2011      Component Value Date/Time   PROT 6.5 03/28/2007 0907   ALBUMIN 3.9 03/28/2007 0907   AST 18 03/28/2007 0907   ALT 14 03/28/2007 0907   ALKPHOS 38 (L) 03/28/2007 0907   BILITOT 0.7 03/28/2007 0907   No results found for: CHOL, HDL, LDLCALC, LDLDIRECT, TRIG, CHOLHDL No results found for: 03/30/2007 No results found for: VITAMINB12 No results found for: TSH    ASSESSMENT AND PLAN 39 y.o. year old female  has a past medical history of Headache(784.0), Hyperemesis, and Infertility, female. here with     ICD-10-CM   1. Migraine with aura and without status migrainosus, not intractable  G43.109 Galcanezumab-gnlm (EMGALITY) 120 MG/ML SOAJ    Caedyn has experienced an increase in frequency and intensity of her migraines since last being seen by Dr. Vernona Rieger.  She feels that Aimovig is no longer working as well as it used to.  She has noticed over this past year that she will have an occasional light prior to the headache.  We have discussed migraines with aura.  She has been warned by GYN to avoid estrogen-based birth control.  She is not on birth control.  Her husband had a vasectomy.  We will discontinue Aimovig.  I will start Emgality every 30 days.  She was advised on appropriate  administration and storage  of this medication.  Side effects discussed.  She will continue Imitrex as needed for abortive therapy.  She was advised to avoid regular use of any abortive or over-the-counter analgesic.  She will follow-up in 6 months.  Advised that if she does well with Emgality we may push follow-up out to 1 year.  She verbalizes understanding and agreement with this plan.   No orders of the defined types were placed in this encounter.    Meds ordered this encounter  Medications  . Galcanezumab-gnlm (EMGALITY) 120 MG/ML SOAJ    Sig: Inject 120 mg into the skin every 30 (thirty) days.    Dispense:  1 pen    Refill:  11    Order Specific Question:   Supervising Provider    Answer:   Anson Fret [id number]      I spent 15 minutes with the patient. 50% of this time was spent counseling and educating patient on plan of care and medications.    Shawnie Dapper, FNP-C 02/20/2019, 12:40 PM [facility] [address] [place], [place] [address] [phone]  Made any corrections needed, and agree with history, physical, neuro exam,assessment and plan as stated.     Naomie Dean, MD [facility]

## 2019-02-25 NOTE — Telephone Encounter (Addendum)
Per CMM, Emgality is approved, no dates listed. Called [facility], spoke with Raynelle Fanning and advised her of approval. She verbalized understanding, appreciation.

## 2019-02-26 ENCOUNTER — Ambulatory Visit: Payer: Managed Care, Other (non HMO) | Admitting: Neurology

## 2019-08-07 ENCOUNTER — Telehealth: Payer: Self-pay | Admitting: *Deleted

## 2019-08-07 NOTE — Telephone Encounter (Signed)
Emgality PA, key: [id number], G43.109, failed: topiramate, nortriptyline, amitriptyline, relpax, propranolol, NSAIDS, Ttylenol

## 2019-08-08 NOTE — Telephone Encounter (Signed)
Answered clinical questions on CMM.

## 2019-08-13 NOTE — Telephone Encounter (Signed)
[facility] received a request for coverage or an exception to the coverage requirements of Emgality 120MG /ML Arial SOAJ for you. As long as you remain covered by your prescription drug plan and there are no changes to your plan benefits, this request is approved for the following time period: 08/08/2019 - 11/08/2019. Approval faxed to [facility] drug, sent patient my chart to advise.

## 2019-08-19 MED FILL — DOXYCYCLINE HYC 100 MG CAPS: 100 | 14 days supply | Qty: 28 | Fill #0

## 2019-08-21 ENCOUNTER — Ambulatory Visit: Payer: Managed Care, Other (non HMO) | Admitting: Family Medicine

## 2019-12-05 ENCOUNTER — Telehealth: Payer: Self-pay | Admitting: *Deleted

## 2019-12-05 ENCOUNTER — Encounter: Payer: Self-pay | Admitting: Family Medicine

## 2019-12-05 NOTE — Telephone Encounter (Incomplete)
Emgality approved until 12/04/2020. Faxed approval letter to [facility] drug and sent her my chart to advise.

## 2019-12-05 NOTE — Telephone Encounter (Signed)
Emgality PA, key: [id number]. G43.109. Your information has been submitted to [employer]. To check for an updated outcome later, reopen this PA request from your dashboard. If [employer] has not responded to your request within 24 hours, contact [employer] at [phone].

## 2020-02-27 ENCOUNTER — Other Ambulatory Visit: Payer: Self-pay

## 2020-02-27 DIAGNOSIS — G43109 Migraine with aura, not intractable, without status migrainosus: Secondary | ICD-10-CM

## 2020-02-27 MED ORDER — EMGALITY 120 MG/ML ~~LOC~~ SOAJ: 120.0000 mg | SUBCUTANEOUS | 0 refills | Status: DC

## 2020-03-12 ENCOUNTER — Telehealth (INDEPENDENT_AMBULATORY_CARE_PROVIDER_SITE_OTHER): Payer: Managed Care, Other (non HMO) | Admitting: Family Medicine

## 2020-03-12 ENCOUNTER — Encounter: Payer: Self-pay | Admitting: Family Medicine

## 2020-03-12 DIAGNOSIS — G43109 Migraine with aura, not intractable, without status migrainosus: Secondary | ICD-10-CM | POA: Diagnosis not present

## 2020-03-12 MED ORDER — TOPIRAMATE 100 MG PO TABS: 100.0000 mg | ORAL_TABLET | Freq: Every day | ORAL | 3 refills | Status: DC

## 2020-03-12 MED ORDER — SUMATRIPTAN SUCCINATE 100 MG PO TABS: 100.0000 mg | ORAL_TABLET | ORAL | 11 refills | Status: DC | PRN

## 2020-03-12 MED ORDER — EMGALITY 120 MG/ML ~~LOC~~ SOAJ: 120.0000 mg | SUBCUTANEOUS | 3 refills | Status: DC

## 2020-03-12 NOTE — Progress Notes (Addendum)
PATIENT: Carolyn Cervantes DOB: [birth year]  REASON FOR VISIT: follow up HISTORY FROM: patient  Virtual Visit via Telephone Note  I connected with Carolyn Cervantes on 03/12/20 at  7:45 AM EST by telephone and verified that I am speaking with the correct person using two identifiers.   I discussed the limitations, risks, security and privacy concerns of performing an evaluation and management service by telephone and the availability of in person appointments. I also discussed with the patient that there may be a patient responsible charge related to this service. The patient expressed understanding and agreed to proceed.   History of Present Illness:  03/12/20 Carolyn Cervantes is a 40 y.o. female here today for follow up for migraines.  She continues Emgality and topiramate 100 mg at bedtime.  Sumatriptan continues to be helpful for abortive therapy.  She may go 3 to 4 weeks with no migraines at all.  She does very well if she can stay on a consistent sleep cycle and maintain diet.  She does endorse significant migraines around the time of her cycle.  She does have a regular cycle and is able to track this.  She is doing very well today and without concerns.   Observations/Objective:  Generalized: Well developed, in no acute distress  Mentation: Alert oriented to time, place, history taking. Follows all commands speech and language fluent   Assessment and Plan:  40 y.o. year old female  has a past medical history of Headache(784.0), Hyperemesis, and Infertility, female. here with    ICD-10-CM   1. Migraine with aura and without status migrainosus, not intractable  G43.109 Galcanezumab-gnlm (EMGALITY) 120 MG/ML SOAJ   She is doing very well. We will continue Emgality and topiramate 100mg  daily. Sumatriptan for abortive therapy. She will try Aleve BID 5 days before expected menstration for menstrual migraines. May consider frovatritpan or naratriptan if needed. Healthy lifestyle habits  encouraged. She will follow up in 1 year, sooner if needed.   No orders of the defined types were placed in this encounter.   Meds ordered this encounter  Medications  . SUMAtriptan (IMITREX) 100 MG tablet    Sig: Take 1 tablet (100 mg total) by mouth every 2 (two) hours as needed for migraine. May repeat in 2 hours if headache persists or recurs.    Dispense:  10 tablet    Refill:  11    Order Specific Question:   Supervising Provider    Answer:   Anson Fret  . topiramate (TOPAMAX) 100 MG tablet    Sig: Take 1 tablet (100 mg total) by mouth at bedtime.    Dispense:  90 tablet    Refill:  3    Order Specific Question:   Supervising Provider    Answer:   [id number] Anson Fret  . Galcanezumab-gnlm (EMGALITY) 120 MG/ML SOAJ    Sig: Inject 120 mg into the skin every 30 (thirty) days.    Dispense:  3 mL    Refill:  3    Order Specific Question:   Supervising Provider    Answer:   [id number] Anson Fret     Follow Up Instructions:  I discussed the assessment and treatment plan with the patient. The patient was provided an opportunity to ask questions and all were answered. The patient agreed with the plan and demonstrated an understanding of the instructions.   The patient was advised to call back or seek an in-person evaluation if the symptoms  worsen or if the condition fails to improve as anticipated.  I provided 15 minutes of non-face-to-face time during this encounter. Patient is located at their place of residence. Provider is in the office.    Shawnie Dapper, NP   Made any corrections needed, and agree with history, physical, neuro exam,assessment and plan as stated.     Naomie Dean, MD [facility]

## 2020-09-07 ENCOUNTER — Other Ambulatory Visit (HOSPITAL_COMMUNITY): Payer: Self-pay

## 2020-09-07 MED ORDER — NURTEC 75 MG PO TBDP
ORAL_TABLET | ORAL | 3 refills | Status: DC
  Filled 2020-09-07: qty 8, 30d supply, fill #0

## 2020-12-23 ENCOUNTER — Telehealth: Payer: Self-pay | Admitting: *Deleted

## 2020-12-23 NOTE — Telephone Encounter (Signed)
Submitted PA Emgality on CMM. Key: [id number]. Waiting on determination from [facility]

## 2020-12-25 NOTE — Telephone Encounter (Signed)
PA approved through [facility] 12/24/2020-12/24/2021

## 2021-02-19 ENCOUNTER — Other Ambulatory Visit: Payer: Self-pay

## 2021-02-19 DIAGNOSIS — I8393 Asymptomatic varicose veins of bilateral lower extremities: Secondary | ICD-10-CM

## 2021-03-02 ENCOUNTER — Ambulatory Visit: Payer: Managed Care, Other (non HMO) | Admitting: Vascular Surgery

## 2021-03-02 ENCOUNTER — Ambulatory Visit (HOSPITAL_COMMUNITY)
Admission: RE | Admit: 2021-03-02 | Discharge: 2021-03-02 | Disposition: A | Discharge status: Home / Self Care | Payer: Managed Care, Other (non HMO) | Source: Ambulatory Visit | Attending: Vascular Surgery | Admitting: Vascular Surgery

## 2021-03-02 ENCOUNTER — Encounter: Payer: Self-pay | Admitting: Vascular Surgery

## 2021-03-02 ENCOUNTER — Other Ambulatory Visit: Payer: Self-pay

## 2021-03-02 DIAGNOSIS — I8393 Asymptomatic varicose veins of bilateral lower extremities: Secondary | ICD-10-CM | POA: Diagnosis present

## 2021-03-02 DIAGNOSIS — I872 Venous insufficiency (chronic) (peripheral): Secondary | ICD-10-CM

## 2021-03-02 NOTE — Progress Notes (Signed)
Patient name: Carolyn Cervantes MRN: [id number] DOB: September 17, 1980 Sex: female  REASON FOR CONSULT: Varicose veins  HPI: Carolyn Cervantes is a 41 y.o. female, with no significant medical history except infertility that presents for evaluation of varicose veins in her lower extremities.  Patient describes a tingling sensation in her feet and calves particularly at night.  She thought this could be restless leg as well.  She does feel the left leg is slightly worse.  She has noticed prominent veins particularly in the right thigh.  He has had no history of DVT.  She does have an extensive family history of DVT including her mom who had 4 DVTs as well as another relative.  No known history of hypercoagulable disorders.  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  Past Medical History:  Diagnosis Date   Headache(784.0)    migraines   Hyperemesis    Infertility, female    clomid    Past Surgical History:  Procedure Laterality Date   WISDOM TOOTH EXTRACTION      Family History  Problem Relation Age of Onset   Cancer Paternal Grandmother        breast   Hypertension Mother    Deep vein thrombosis Mother    Hypertension Father    CAD Father        CABG   Migraines Father    Diabetes Maternal Aunt    Irritable bowel syndrome Maternal Aunt    Diabetes Maternal Uncle    Lupus Paternal Uncle    Heart disease Maternal Grandmother    Diabetes Maternal Grandmother    Stroke Maternal Grandmother    Heart disease Maternal Grandfather    Diabetes Maternal Grandfather    Heart disease Paternal Grandfather    Migraines Sister     SOCIAL HISTORY: Social History   Socioeconomic History   Marital status: Married    Spouse name: Not on file   Number of children: 2   Years of education: Not on file   Highest education level: Associate degree: academic program  Occupational History   Not on file  Tobacco Use   Smoking status: Never    Smokeless tobacco: Never  Vaping Use   Vaping Use: Not on file  Substance and Sexual Activity   Alcohol use: Never   Drug use: Never   Sexual activity: Yes    Partners: Male    Comment: husband had vasectomy  Other Topics Concern   Not on file  Social History Narrative   Lives at home with husband & children   Right handed   Caffeine: coffee twice daily (2 cups)   Social Determinants of Health   Financial Resource Strain: Not on file  Food Insecurity: Not on file  Transportation Needs: Not on file  Physical Activity: Not on file  Stress: Not on file  Social Connections: Not on file  Intimate Partner Violence: Not on file    Allergies  Allergen Reactions   Codeine Anaphylaxis    Pt reports tolerating ultram in the past.    Current Outpatient Medications  Medication Sig Dispense Refill   cetirizine (ZYRTEC) 10 MG tablet Take 10 mg by mouth daily as needed for allergies.     Galcanezumab-gnlm (EMGALITY) 120 MG/ML SOAJ Inject 120 mg into the skin every 30 (thirty) days. 3 mL 3   SUMAtriptan (IMITREX) 100 MG tablet Take 1 tablet (100 mg total) by mouth every 2 (two) hours  as needed for migraine. May repeat in 2 hours if headache persists or recurs. 10 tablet 11   topiramate (TOPAMAX) 100 MG tablet Take 1 tablet (100 mg total) by mouth at bedtime. 90 tablet 3   Rimegepant Sulfate (NURTEC) 75 MG TBDP Place 1 tablet on the tongue and allow to dissolve for 1 dose for migraine as needed. Max of 1 dose per 24 hours 8 tablet 3   No current facility-administered medications for this visit.    REVIEW OF SYSTEMS:  [X]  denotes positive finding, [ ]  denotes negative finding Cardiac  Comments:  Chest pain or chest pressure:    Shortness of breath upon exertion:    Short of breath when lying flat:    Irregular heart rhythm:        Vascular    Pain in calf, thigh, or hip brought on by ambulation:    Pain in feet at night that wakes you up from your sleep:     Blood clot in your  veins:    Leg swelling:         Pulmonary    Oxygen at home:    Productive cough:     Wheezing:         Neurologic    Sudden weakness in arms or legs:     Sudden numbness in arms or legs:     Sudden onset of difficulty speaking or slurred speech:    Temporary loss of vision in one eye:     Problems with dizziness:         Gastrointestinal    Blood in stool:     Vomited blood:         Genitourinary    Burning when urinating:     Blood in urine:        Psychiatric    Major depression:         Hematologic    Bleeding problems:    Problems with blood clotting too easily:        Skin    Rashes or ulcers:        Constitutional    Fever or chills:      PHYSICAL EXAM: Vitals:   03/02/21 1213  BP: 100/69  Pulse: 98  Resp: 14  Temp: 98.1 F (36.7 C)  TempSrc: Temporal  SpO2: 97%  Weight: 140 lb (63.5 kg)  Height: 5\' 4"  (1.626 m)    GENERAL: The patient is a well-nourished female, in no acute distress. The vital signs are documented above. CARDIAC: There is a regular rate and rhythm.  VASCULAR:  Palpable femoral pulses bilaterally Palpable DP/PT pulses bilaterally Prominent spider veins right thigh  No large varicosities PULMONARY: No respiratory distress ABDOMEN: Soft and non-tender MUSCULOSKELETAL: There are no major deformities or cyanosis. NEUROLOGIC: No focal weakness or paresthesias are detected. SKIN: There are no ulcers or rashes noted. PSYCHIATRIC: The patient has a normal affect.  DATA:   Lower Venous Reflux Study   Patient Name:  Carolyn Cervantes  Date of Exam:   03/02/2021  Medical Rec #: [id number]        Accession #:    [id number]  Date of Birth: 1980/04/13        Patient Gender: F  Patient Age:   [age] years  [facility]  Procedure:      VAS US LOWER EXTREMITY VENOUS REFLUX  Referring Phys: Sherald HessHRISTOPHER Westly Hinnant    ---------------------------------------------------------------------------  -----  Indications: Varicosities.     Performing Technologist: Thereasa ParkinHelene Cestone RVT      Examination Guidelines: A complete evaluation includes B-mode imaging,  spectral  Doppler, color Doppler, and power Doppler as needed of all accessible  portions  of each vessel. Bilateral testing is considered an integral part of a  complete  examination. Limited examinations for reoccurring indications may be  performed  as noted. The reflux portion of the exam is performed with the patient in  reverse Trendelenburg.  Significant venous reflux is defined as >500 ms in the superficial venous  system, and >1 second in the deep venous system.      +--------------+---------+------+-----------+------------+--------+   LEFT           Reflux No Reflux Reflux Time Diameter cms Comments                              Yes                                       +--------------+---------+------+-----------+------------+--------+   CFV                       yes    >1 second                          +--------------+---------+------+-----------+------------+--------+   FV prox        no                                                   +--------------+---------+------+-----------+------------+--------+   FV mid         no                                                   +--------------+---------+------+-----------+------------+--------+   FV dist        no                                                   +--------------+---------+------+-----------+------------+--------+   Popliteal      no                                                   +--------------+---------+------+-----------+------------+--------+   GSV at SFJ                yes     >500 ms      0.421                +--------------+---------+------+-----------+------------+--------+   GSV prox thigh no                               0.24                +--------------+---------+------+-----------+------------+--------+  GSV mid thigh  no                                0.25                +--------------+---------+------+-----------+------------+--------+   GSV dist thigh no                              0.323                +--------------+---------+------+-----------+------------+--------+   GSV at knee    no                              0.328                +--------------+---------+------+-----------+------------+--------+   GSV prox calf  no                              0.178                +--------------+---------+------+-----------+------------+--------+   SSV Pop Fossa                                            NV         +--------------+---------+------+-----------+------------+--------+   SSV prox calf                                            NV         +--------------+---------+------+-----------+------------+--------+   SSV mid calf   no                               0.13                +--------------+---------+------+-----------+------------+--------+          Summary:  Left:  - No evidence of deep vein thrombosis seen in the left lower extremity,  from the common femoral through the popliteal veins.  - No evidence of superficial venous thrombosis in the left lower  extremity.  - Deep vein reflux in the CFV.  - Superficial vein reflux in the SFJ.   Assessment/Plan:  41 year old female presents for evaluation of varicose veins in her lower extremities.  Today she had a venous insufficiency/reflux study of the left lower extremity and has reflux in the common femoral vein and at the saphenofemoral junction.  Discussed given dominant deep venous reflux with no long segment superficial reflux would recommend conservative management and not a candidate for laser ablation etc at this time.  Discussed leg elevation with compression and exercise.  We will convert her from knee-high to a thigh-high stocking today with medical grade 20 to 30 mmHg compression for added support.  She does have notable spider veins in the right thigh  that she would possibly like treated and I discussed the option of sclerotherapy.  I did have her meet with our sclerotherapy nurse today and she will call back to schedule if she wants to proceed.  Discussed  no evidence of DVT and that has been a concern given family history.  We also discussed the possibility of a reflux study of her right leg in the future but will defer that at this time.   Cephus Shelling, MD [facility] Office: [phone]

## 2021-03-08 ENCOUNTER — Other Ambulatory Visit: Payer: Self-pay

## 2021-03-08 DIAGNOSIS — G43109 Migraine with aura, not intractable, without status migrainosus: Secondary | ICD-10-CM

## 2021-03-08 MED ORDER — EMGALITY 120 MG/ML ~~LOC~~ SOAJ: 120.0000 mg | SUBCUTANEOUS | 0 refills | Status: DC

## 2021-03-23 ENCOUNTER — Other Ambulatory Visit: Payer: Self-pay

## 2021-03-23 MED ORDER — TOPIRAMATE 100 MG PO TABS: 100.0000 mg | ORAL_TABLET | Freq: Every day | ORAL | 0 refills | Status: DC

## 2021-03-23 MED ORDER — SUMATRIPTAN SUCCINATE 100 MG PO TABS: 100.0000 mg | ORAL_TABLET | ORAL | 0 refills | Status: DC | PRN

## 2021-04-12 NOTE — Progress Notes (Signed)
? ?PATIENT: Carolyn Cervantes ?DOB: 09-04-1980 ? ?REASON FOR VISIT: follow up ?HISTORY FROM: patient ? ?Virtual Visit via Telephone Note ? ?I connected with ABREE ROMICK on 04/13/21 at  9:00 AM EST by telephone and verified that I am speaking with the correct person using two identifiers. ?  ?I discussed the limitations, risks, security and privacy concerns of performing an evaluation and management service by telephone and the availability of in person appointments. I also discussed with the patient that there may be a patient responsible charge related to this service. The patient expressed understanding and agreed to proceed. ? ? ?History of Present Illness: ? ?04/13/21 ALL (mychart): ?Carolyn Cervantes returns for follow up for migraines. She continues to do well on Emgality monthly and topiramate 100mg  QHS. Sumatriptan works well for abortive therapy. She feels that migraines are fairly well managed. Naproxen helps with menstrual migraines. She is having 3-4 migraines per month but reports taking sumatriptan more often to prevent a migraine.  ? ? ?03/12/2020 ALL (mychart):  ?Carolyn Cervantes is a 41 y.o. female here today for follow up for migraines.  She continues Emgality and topiramate 100 mg at bedtime.  Sumatriptan continues to be helpful for abortive therapy.  She may go 3 to 4 weeks with no migraines at all.  She does very well if she can stay on a consistent sleep cycle and maintain diet.  She does endorse significant migraines around the time of her cycle.  She does have a regular cycle and is able to track this.  She is doing very well today and without concerns. ? ? ?Observations/Objective: ? ?Generalized: Well developed, in no acute distress  ?Mentation: Alert oriented to time, place, history taking. Follows all commands speech and language fluent ? ? ?Assessment and Plan: ? ?41 y.o. year old female  has a past medical history of Headache(784.0), Hyperemesis, and Infertility, female. here with ? ?  ICD-10-CM    ?1. Migraine with aura and without status migrainosus, not intractable  G43.109 Galcanezumab-gnlm (EMGALITY) 120 MG/ML SOAJ  ?  ? ? ?She is doing fairly well. We will continue Emgality monthly. I will have her increase topiramate to 150mg  QHS for the next month to see if this helps. She may continue 150mg  daily if beneficial. She will call me with any concerns or worsening symptoms. Sumatriptan for abortive therapy. She will continue Aleve BID 5 days before expected menstration for menstrual migraines. May consider frovatritpan or naratriptan if needed. Healthy lifestyle habits encouraged. She was advised against pregnancy. Husband is s/p vasectomy. She will follow up in 1 year, sooner if needed.  ? ?No orders of the defined types were placed in this encounter. ? ? ?Meds ordered this encounter  ?Medications  ? Galcanezumab-gnlm (EMGALITY) 120 MG/ML SOAJ  ?  Sig: Inject 120 mg into the skin every 30 (thirty) days.  ?  Dispense:  3 mL  ?  Refill:  3  ?  Order Specific Question:   Supervising Provider  ?  Answer24  ? SUMAtriptan (IMITREX) 100 MG tablet  ?  Sig: Take 1 tablet (100 mg total) by mouth every 2 (two) hours as needed for migraine. May repeat in 2 hours if headache persists or recurs.  ?  Dispense:  10 tablet  ?  Refill:  11  ?  Order Specific Question:   Supervising Provider  ?  Answer Anson Fret  ? topiramate (TOPAMAX) 100 MG tablet  ?  Sig: Take 1.5 tablets (150 mg total) by mouth at bedtime.  ?  Dispense:  135 tablet  ?  Refill:  3  ?  Order Specific Question:   Supervising Provider  ?  AnswerAnson Fret [[id number]]  ? ? ? ?Follow Up Instructions: ? ?I discussed the assessment and treatment plan with the patient. The patient was provided an opportunity to ask questions and all were answered. The patient agreed with the plan and demonstrated an understanding of the instructions. ?  ?The patient was advised to call back or seek an in-person evaluation  if the symptoms worsen or if the condition fails to improve as anticipated. ? ?I provided 15 minutes of non-face-to-face time during this encounter. Patient is located at their place of residence. Provider is in the office.  ? ? ?Shawnie Dapper, NP  ? ? ? ?

## 2021-04-12 NOTE — Patient Instructions (Addendum)
Below is our plan: ? ?We will continue Emgality monthly and sumatriptan as needed. We will increase topiramate to 150mg  at bedtime. Try increased dose for 3-4 weeks. May continue if helpful or reduce dose back to 100mg  daily if you prefer. Call me with any questions or concerns.  ? ?Please make sure you are staying well hydrated. I recommend 50-60 ounces daily. Well balanced diet and regular exercise encouraged. Consistent sleep schedule with 6-8 hours recommended.  ? ?Please continue follow up with care team as directed.  ? ?Follow up with me in 1 year ? ?You may receive a survey regarding today's visit. I encourage you to leave honest feed back as I do use this information to improve patient care. Thank you for seeing me today!  ? ? ?

## 2021-04-13 ENCOUNTER — Telehealth: Payer: Managed Care, Other (non HMO) | Admitting: Family Medicine

## 2021-04-13 ENCOUNTER — Encounter: Payer: Self-pay | Admitting: Family Medicine

## 2021-04-13 DIAGNOSIS — G43109 Migraine with aura, not intractable, without status migrainosus: Secondary | ICD-10-CM

## 2021-04-13 MED ORDER — TOPIRAMATE 100 MG PO TABS: 150.0000 mg | ORAL_TABLET | Freq: Every day | ORAL | 3 refills | Status: DC

## 2021-04-13 MED ORDER — SUMATRIPTAN SUCCINATE 100 MG PO TABS: 100.0000 mg | ORAL_TABLET | ORAL | 11 refills | Status: DC | PRN

## 2021-04-13 MED ORDER — EMGALITY 120 MG/ML ~~LOC~~ SOAJ: 120.0000 mg | SUBCUTANEOUS | 3 refills | Status: DC

## 2021-08-18 ENCOUNTER — Telehealth: Payer: Self-pay | Admitting: Neurology

## 2021-08-18 NOTE — Telephone Encounter (Signed)
PA completed through [employer]MM/[employer] KEY: [id number] Will await determination

## 2021-08-19 NOTE — Telephone Encounter (Signed)
Received a questionnaire form to complete for [employer] signed and faxed to the number provided.

## 2021-08-24 NOTE — Telephone Encounter (Addendum)
Received paper questions to complete for the patient. I have completed the questions and faxed to the number provided along with fax notes. Will await response

## 2021-08-26 NOTE — Telephone Encounter (Signed)
PA approved effective from 08/18/2021 through 11/09/2021.

## 2021-09-27 DIAGNOSIS — Z79899 Other long term (current) drug therapy: Secondary | ICD-10-CM | POA: Diagnosis not present

## 2021-09-27 DIAGNOSIS — Z13220 Encounter for screening for lipoid disorders: Secondary | ICD-10-CM | POA: Diagnosis not present

## 2021-09-27 DIAGNOSIS — Z131 Encounter for screening for diabetes mellitus: Secondary | ICD-10-CM | POA: Diagnosis not present

## 2021-09-27 DIAGNOSIS — G43909 Migraine, unspecified, not intractable, without status migrainosus: Secondary | ICD-10-CM | POA: Diagnosis not present

## 2021-11-17 ENCOUNTER — Telehealth: Payer: Self-pay | Admitting: Neurology

## 2021-11-17 NOTE — Telephone Encounter (Signed)
PA completed on [employer]MM[employer] [id number] Will await determination

## 2021-11-18 NOTE — Telephone Encounter (Signed)
PA approved effective from 11/17/2021 through 11/17/2022.

## 2022-03-10 ENCOUNTER — Other Ambulatory Visit (HOSPITAL_COMMUNITY): Payer: Self-pay

## 2022-03-10 MED ORDER — EMGALITY 120 MG/ML ~~LOC~~ SOAJ
120.0000 mg | SUBCUTANEOUS | 1 refills | Status: DC
  Filled 2022-03-10: qty 1, 30d supply, fill #0
  Filled 2022-04-05: qty 1, 30d supply, fill #1

## 2022-03-11 ENCOUNTER — Other Ambulatory Visit (HOSPITAL_COMMUNITY): Payer: Self-pay

## 2022-03-31 ENCOUNTER — Other Ambulatory Visit: Payer: Self-pay

## 2022-03-31 MED ORDER — SUMATRIPTAN SUCCINATE 100 MG PO TABS: 100.0000 mg | ORAL_TABLET | ORAL | 0 refills | Status: DC | PRN

## 2022-03-31 MED ORDER — TOPIRAMATE 100 MG PO TABS: 150.0000 mg | ORAL_TABLET | Freq: Every day | ORAL | 0 refills | Status: DC

## 2022-04-05 ENCOUNTER — Other Ambulatory Visit (HOSPITAL_COMMUNITY): Payer: Self-pay

## 2022-05-02 ENCOUNTER — Other Ambulatory Visit (HOSPITAL_COMMUNITY): Payer: Self-pay

## 2022-05-02 ENCOUNTER — Other Ambulatory Visit: Payer: Self-pay | Admitting: Family Medicine

## 2022-05-03 ENCOUNTER — Other Ambulatory Visit (HOSPITAL_COMMUNITY): Payer: Self-pay

## 2022-05-03 MED ORDER — EMGALITY 120 MG/ML ~~LOC~~ SOAJ
120.0000 mg | SUBCUTANEOUS | 0 refills | Status: DC
  Filled 2022-05-03: qty 1, 30d supply, fill #0

## 2022-05-03 NOTE — Telephone Encounter (Signed)
Last seen 04/13/21 No follow up visit scheduled

## 2022-05-16 ENCOUNTER — Other Ambulatory Visit: Payer: Self-pay | Admitting: *Deleted

## 2022-05-16 NOTE — Telephone Encounter (Signed)
Last video visit was 04/13/21 No follow up scheduled  Last filled on 04/14/22 # 10 tablets ( 30 day supply)

## 2022-06-07 ENCOUNTER — Other Ambulatory Visit: Payer: Self-pay | Admitting: Family Medicine

## 2022-06-07 ENCOUNTER — Other Ambulatory Visit (HOSPITAL_COMMUNITY): Payer: Self-pay

## 2022-06-07 MED ORDER — EMGALITY 120 MG/ML ~~LOC~~ SOAJ
120.0000 mg | SUBCUTANEOUS | 0 refills | Status: DC
  Filled 2022-06-07: qty 1, 30d supply, fill #0

## 2022-06-07 NOTE — Telephone Encounter (Signed)
Last seen on 04/13/21 per note "We will continue Emgality monthly and sumatriptan as needed. " No follow up scheduled

## 2022-06-08 ENCOUNTER — Other Ambulatory Visit (HOSPITAL_COMMUNITY): Payer: Self-pay

## 2022-06-14 DIAGNOSIS — Z6824 Body mass index (BMI) 24.0-24.9, adult: Secondary | ICD-10-CM | POA: Diagnosis not present

## 2022-06-14 DIAGNOSIS — Z1231 Encounter for screening mammogram for malignant neoplasm of breast: Secondary | ICD-10-CM | POA: Diagnosis not present

## 2022-06-14 DIAGNOSIS — Z01419 Encounter for gynecological examination (general) (routine) without abnormal findings: Secondary | ICD-10-CM | POA: Diagnosis not present

## 2022-06-14 DIAGNOSIS — N951 Menopausal and female climacteric states: Secondary | ICD-10-CM | POA: Diagnosis not present

## 2022-06-15 ENCOUNTER — Other Ambulatory Visit: Payer: Self-pay

## 2022-06-15 MED ORDER — SUMATRIPTAN SUCCINATE 100 MG PO TABS: 100.0000 mg | ORAL_TABLET | ORAL | 4 refills | Status: DC | PRN

## 2022-07-05 ENCOUNTER — Other Ambulatory Visit: Payer: Self-pay | Admitting: Family Medicine

## 2022-07-05 ENCOUNTER — Other Ambulatory Visit (HOSPITAL_COMMUNITY): Payer: Self-pay

## 2022-07-05 MED ORDER — EMGALITY 120 MG/ML ~~LOC~~ SOAJ
120.0000 mg | SUBCUTANEOUS | 3 refills | Status: DC
  Filled 2022-07-05 – 2022-07-06 (×2): qty 1, 30d supply, fill #0
  Filled 2022-08-02: qty 1, 30d supply, fill #1
  Filled 2022-09-01: qty 1, 30d supply, fill #2
  Filled 2022-09-29: qty 1, 30d supply, fill #3

## 2022-07-05 NOTE — Telephone Encounter (Signed)
Last seen on 04/13/21 Follow up scheduled on 12/01/22 Last filled on 06/08/22

## 2022-07-06 ENCOUNTER — Other Ambulatory Visit (HOSPITAL_COMMUNITY): Payer: Self-pay

## 2022-07-14 ENCOUNTER — Other Ambulatory Visit: Payer: Self-pay | Admitting: *Deleted

## 2022-07-19 ENCOUNTER — Other Ambulatory Visit: Payer: Self-pay

## 2022-07-19 MED ORDER — TOPIRAMATE 100 MG PO TABS: 150.0000 mg | ORAL_TABLET | Freq: Every day | ORAL | 4 refills | Status: DC

## 2022-08-02 ENCOUNTER — Other Ambulatory Visit: Payer: Self-pay

## 2022-09-07 ENCOUNTER — Other Ambulatory Visit (HOSPITAL_BASED_OUTPATIENT_CLINIC_OR_DEPARTMENT_OTHER): Payer: Self-pay

## 2022-09-07 DIAGNOSIS — H31003 Unspecified chorioretinal scars, bilateral: Secondary | ICD-10-CM | POA: Diagnosis not present

## 2022-09-07 DIAGNOSIS — H52203 Unspecified astigmatism, bilateral: Secondary | ICD-10-CM | POA: Diagnosis not present

## 2022-09-07 DIAGNOSIS — G43909 Migraine, unspecified, not intractable, without status migrainosus: Secondary | ICD-10-CM | POA: Diagnosis not present

## 2022-10-03 DIAGNOSIS — G43909 Migraine, unspecified, not intractable, without status migrainosus: Secondary | ICD-10-CM | POA: Diagnosis not present

## 2022-10-03 DIAGNOSIS — Z6824 Body mass index (BMI) 24.0-24.9, adult: Secondary | ICD-10-CM | POA: Diagnosis not present

## 2022-10-05 ENCOUNTER — Other Ambulatory Visit (HOSPITAL_COMMUNITY): Payer: Self-pay

## 2022-10-12 DIAGNOSIS — Z79899 Other long term (current) drug therapy: Secondary | ICD-10-CM | POA: Diagnosis not present

## 2022-11-23 NOTE — Patient Instructions (Signed)
Below is our plan:  We will switch Emgality to Ajovy. Take 1 injection every 30 days. Continue sumatriptan as needed. Consider decreasing topiramate to 100mg  at bedtime if tolerating Ajovy.   Please make sure you are staying well hydrated. I recommend 50-60 ounces daily. Well balanced diet and regular exercise encouraged. Consistent sleep schedule with 6-8 hours recommended.   Please continue follow up with care team as directed.   Follow up with me in 6-12 months   You may receive a survey regarding today's visit. I encourage you to leave honest feed back as I do use this information to improve patient care. Thank you for seeing me today!   GENERAL HEADACHE INFORMATION:   Natural supplements: Magnesium Oxide or Magnesium Glycinate 500 mg at bed (up to 800 mg daily) Coenzyme Q10 300 mg in AM Vitamin B2- 200 mg twice a day   Add 1 supplement at a time since even natural supplements can have undesirable side effects. You can sometimes buy supplements cheaper (especially Coenzyme Q10) at www.WebmailGuide.co.za or at [facility].  Migraine with aura: There is increased risk for stroke in women with migraine with aura and a contraindication for the combined contraceptive pill for use by women who have migraine with aura. The risk for women with migraine without aura is lower. However other risk factors like smoking are far more likely to increase stroke risk than migraine. There is a recommendation for no smoking and for the use of OCPs without estrogen such as progestogen only pills particularly for women with migraine with aura.Marland Kitchen People who have migraine headaches with auras may be 3 times more likely to have a stroke caused by a blood clot, compared to migraine patients who don't see auras. Women who take hormone-replacement therapy may be 30 percent more likely to suffer a clot-based stroke than women not taking medication containing estrogen. Other risk factors like smoking and high blood pressure may be   much more important.    Vitamins and herbs that show potential:   Magnesium: Magnesium (250 mg twice a day or 500 mg at bed) has a relaxant effect on smooth muscles such as blood vessels. Individuals suffering from frequent or daily headache usually have low magnesium levels which can be increase with daily supplementation of 400-750 mg. Three trials found 40-90% average headache reduction  when used as a preventative. Magnesium may help with headaches are aura, the best evidence for magnesium is for migraine with aura is its thought to stop the cortical spreading depression we believe is the pathophysiology of migraine aura.Magnesium also demonstrated the benefit in menstrually related migraine.  Magnesium is part of the messenger system in the serotonin cascade and it is a good muscle relaxant.  It is also useful for constipation which can be a side effect of other medications used to treat migraine. Good sources include nuts, whole grains, and tomatoes. Side Effects: loose stool/diarrhea  Riboflavin (vitamin B 2) 200 mg twice a day. This vitamin assists nerve cells in the production of ATP a principal energy storing molecule.  It is necessary for many chemical reactions in the body.  There have been at least 3 clinical trials of riboflavin using 400 mg per day all of which suggested that migraine frequency can be decreased.  All 3 trials showed significant improvement in over half of migraine sufferers.  The supplement is found in bread, cereal, milk, meat, and poultry.  Most Americans get more riboflavin than the recommended daily allowance, however riboflavin deficiency is  not necessary for the supplements to help prevent headache. Side effects: energizing, green urine   Coenzyme Q10: This is present in almost all cells in the body and is critical component for the conversion of energy.  Recent studies have shown that a nutritional supplement of CoQ10 can reduce the frequency of migraine attacks by  improving the energy production of cells as with riboflavin.  Doses of 150 mg twice a day have been shown to be effective.   Melatonin: Increasing evidence shows correlation between melatonin secretion and headache conditions.  Melatonin supplementation has decreased headache intensity and duration.  It is widely used as a sleep aid.  Sleep is natures way of dealing with migraine.  A dose of 3 mg is recommended to start for headaches including cluster headache. Higher doses up to 15 mg has been reviewed for use in Cluster headache and have been used. The rationale behind using melatonin for cluster is that many theories regarding the cause of Cluster headache center around the disruption of the normal circadian rhythm in the brain.  This helps restore the normal circadian rhythm.   HEADACHE DIET: Foods and beverages which may trigger migraine Note that only 20% of headache patients are food sensitive. You will know if you are food sensitive if you get a headache consistently 20 minutes to 2 hours after eating a certain food. Only cut out a food if it causes headaches, otherwise you might remove foods you enjoy! What matters most for diet is to eat a well balanced healthy diet full of vegetables and low fat protein, and to not miss meals.   Chocolate, other sweets ALL cheeses except cottage and cream cheese Dairy products, yogurt, sour cream, ice cream Liver Meat extracts (Bovril, Marmite, meat tenderizers) Meats or fish which have undergone aging, fermenting, pickling or smoking. These include: Hotdogs,salami,Lox,sausage, mortadellas,smoked salmon, pepperoni, Pickled herring Pods of broad bean (English beans, Chinese pea pods, [place] (fava) beans, lima and navy beans Ripe avocado, ripe banana Yeast extracts or active yeast preparations such as Brewer's or Fleishman's (commercial bakes goods are permitted) Tomato based foods, pizza (lasagna, etc.)   MSG (monosodium glutamate) is disguised as many  things; look for these common aliases: Monopotassium glutamate Autolysed yeast Hydrolysed protein Sodium caseinate "flavorings" "all natural preservatives" Nutrasweet   Avoid all other foods that convincingly provoke headaches.   Resources: The Dizzy [place]air Laundry Your Headache Diet, migrainestrong.com  https://zamora-andrews.com/   Caffeine and Migraine For patients that have migraine, caffeine intake more than 3 days per week can lead to dependency and increased migraine frequency. I would recommend cutting back on your caffeine intake as best you can. The recommended amount of caffeine is 200-300 mg daily, although migraine patients may experience dependency at even lower doses. While you may notice an increase in headache temporarily, cutting back will be helpful for headaches in the long run. For more information on caffeine and migraine, visit: https://americanmigrainefoundation.org/resource-library/caffeine-and-migraine/   Headache Prevention Strategies:   1. Maintain a headache diary; learn to identify and avoid triggers.  - This can be a simple note where you log when you had a headache, associated symptoms, and medications used - There are several smartphone apps developed to help track migraines: Migraine Buddy, Migraine Monitor, Curelator N1-Headache App   Common triggers include: Emotional triggers: Emotional/Upset family or friends Emotional/Upset occupation Business reversal/success Anticipation anxiety Crisis-serious Post-crisis periodNew job/position   Physical triggers: Vacation Day Weekend Strenuous Exercise High Altitude Location New Move Menstrual Day Physical Illness Oversleep/Not enough  sleep Weather changes Light: Photophobia or light sesnitivity treatment involves a balance between desensitization and reduction in overly strong input. Use dark polarized glasses outside, but not inside. Avoid bright or  fluorescent light, but do not dim environment to the point that going into a normally lit room hurts. Consider FL-41 tint lenses, which reduce the most irritating wavelengths without blocking too much light.  These can be obtained at axonoptics.com or theraspecs.com Foods: see list above.   2. Limit use of acute treatments (over-the-counter medications, triptans, etc.) to no more than 2 days per week or 10 days per month to prevent medication overuse headache (rebound headache).     3. Follow a regular schedule (including weekends and holidays): Don't skip meals. Eat a balanced diet. 8 hours of sleep nightly. Minimize stress. Exercise 30 minutes per day. Being overweight is associated with a 5 times increased risk of chronic migraine. Keep well hydrated and drink 6-8 glasses of water per day.   4. Initiate non-pharmacologic measures at the earliest onset of your headache. Rest and quiet environment. Relax and reduce stress. Breathe2Relax is a free app that can instruct you on    some simple relaxtion and breathing techniques. Http://Dawnbuse.com is a    free website that provides teaching videos on relaxation.  Also, there are  many apps that   can be downloaded for "mindful" relaxation.  An app called YOGA NIDRA will help walk you through mindfulness. Another app called Calm can be downloaded to give you a structured mindfulness guide with daily reminders and skill development. Headspace for guided meditation Mindfulness Based Stress Reduction Online Course: www.palousemindfulness.com Cold compresses.   5. Don't wait!! Take the maximum allowable dosage of prescribed medication at the first sign of migraine.   6. Compliance:  Take prescribed medication regularly as directed and at the first sign of a migraine.   7. Communicate:  Call your physician when problems arise, especially if your headaches change, increase in frequency/severity, or become associated with neurological symptoms (weakness,  numbness, slurred speech, etc.). Proceed to emergency room if you experience new or worsening symptoms or symptoms do not resolve, if you have new neurologic symptoms or if headache is severe, or for any concerning symptom.   8. Headache/pain management therapies: Consider various complementary methods, including medication, behavioral therapy, psychological counselling, biofeedback, massage therapy, acupuncture, dry needling, and other modalities.  Such measures may reduce the need for medications. Counseling for pain management, where patients learn to function and ignore/minimize their pain, seems to work very well.   9. Recommend changing family's attention and focus away from patient's headaches. Instead, emphasize daily activities. If first question of day is 'How are your headaches/Do you have a headache today?', then patient will constantly think about headaches, thus making them worse. Goal is to re-direct attention away from headaches, toward daily activities and other distractions.   10. Helpful Websites: www.AmericanHeadacheSociety.org PatentHood.ch www.headaches.org TightMarket.nl www.achenet.org

## 2022-11-23 NOTE — Progress Notes (Signed)
PATIENT: Carolyn Cervantes DOB: 09/02/1980  REASON FOR VISIT: follow up HISTORY FROM: patient  Chief Complaint  Patient presents with   Follow-up    Pt in room 2. Here for migraine follow up. Pt said migraines are stable, PCP and increased topamax 200 mg tablets. Pt said she doesn't noticed any difference. Pt averages a migraine at least once a week, sometimes 2 migraines during menstrual cycles.     HISTORY OF PRESENT ILLNESS:  11/28/22 ALL:  Carolyn Cervantes returns for follow up for migraines. She was last seen 04/2021 and doing well on Emgality, topiramate 100mg  at bedtime and sumatriptan PRN. PCP increased topiramate to 200mg  daily about 2 months ago. Since, she reports doing fairly well. She reports having about 4 migraines per month. She is unsure increased dose of topiramate has been helpful. Sumatriptan works well. She rarely has to repeat dose. Menstrual cycles last two days but migraines are more intense during this time. She has had more stressors, recently. She is followed closely by PCP. Recently seen by GYN and ophthalmology with normal eval.   Tried and Failed: Emgality (on now), Amovig (ineffective), topiramate, propranolol, relpax, sumatriptan, rizatriptan, amitriptyline, nortriptyline, nsaids, tylenol  Ajovy sample provided in office.   04/13/21 ALL (mychart): Carolyn Cervantes returns for follow up for migraines. She continues to do well on Emgality monthly and topiramate 100mg  QHS. Sumatriptan works well for abortive therapy. She feels that migraines are fairly well managed. Naproxen helps with menstrual migraines. She is having 3-4 migraines per month but reports taking sumatriptan more often to prevent a migraine.   03/12/2020 ALL (mychart):  Carolyn Cervantes is a 42 y.o. female here today for follow up for migraines.  She continues Emgality and topiramate 100 mg at bedtime.  Sumatriptan continues to be helpful for abortive therapy.  She may go 3 to 4 weeks with no migraines at all.  She does  very well if she can stay on a consistent sleep cycle and maintain diet.  She does endorse significant migraines around the time of her cycle.  She does have a regular cycle and is able to track this.  She is doing very well today and without concerns.  02/20/2019 ALL:  Carolyn Cervantes is a 42 y.o. female here today for follow up for migraines. She was seen by Dr Lucia Gaskins last in 01/2018. She was started on Amovig. She tried to taper topiramate dose but headaches worsened. She continues topiramate 100mg  at bedtime. She reports doing very well in the first half of the year. Over the past few months, she feels that headaches are worsening. They are no were as bad as they were but seem to be occurring more frequently. She may have 2-5 migrainous 4:00 in the morning and at days per month. Usually unilateral sharp shooting pain. Sometimes pounding. Her eye will tear up. Rest and sleep help. She always has a bad migraine at the start of her menstrual cycle. She has noted some flashing lights before certain migraines. Imitrex does help. She was advised about a month ago by dermatology to stop ibuprofen just to be safe due to idiopathic urticaria. She is treated for seasonal allergies with daily cetirizine and pseudoephedrine as needed.   Failed Topiramate, relpax and other triptans, amitriptyline, nortriptyline, nsaids,tylenol, propranolol  HISTORY: (copied from Dr Trevor Mace note on 01/25/2018)  HPI:  Carolyn Cervantes is a 42 y.o. female here as requested by Dr. Sudie Bailey for migraines.  Past medical history migraines. Migraines started in  HS. She [facility]. She did pharm studies in college. Usually on right side beind the temples pounding/ pulsating, throbbing, nausea, light and sound sensitivity, +nausea and recent vomiting, increasing frequency. No med overuse. No aura.  Can last 24-72 hours. 16 migraine days a month. She is also irritable. Topamax side effects. Ongoing for over a year. She  has had vision changes. She has significant eye pain as well. Ophthalmology said her eyes were fine. She can wake with headaches, can improve after getting up in the morning.No other focal neurologic deficits, associated symptoms, inciting events or modifiable factors.   Reviewed notes, labs and imaging from outside physicians, which showed:   Reviewed notes from Dr. Blanchard Mane.  Patient reports that with the increase in Topamax initially felt like it helped but it is like the benefit is worn off.  Her eyes have started to hurt.  She went to an eye specialist for checkup and everything was okay.  She has to take the Imitrex about 2 times per week.  If she does not get ahead of the migraine she will have a headache for 2 days.  To get rid of the headache she will take an Imitrex using a pack lay down drink caffeine.  Alona Bene has a headache in the right temple area.  The headache will often wake her up at like 3 AM.  She does work night shift.  Reviewed exam which was normal.  Never smoker.  Overall very healthy female.  No significant past medical history.   REVIEW OF SYSTEMS: Out of a complete 14 system review of symptoms, the patient complains only of the following symptoms, headaches and all other reviewed systems are negative.  ALLERGIES: Allergies  Allergen Reactions   Codeine Anaphylaxis    Pt reports tolerating ultram in the past.    HOME MEDICATIONS: Outpatient Medications Prior to Visit  Medication Sig Dispense Refill   cetirizine (ZYRTEC) 10 MG tablet Take 10 mg by mouth daily as needed for allergies.     topiramate (TOPAMAX) 100 MG tablet Take 1.5 tablets (150 mg total) by mouth at bedtime. 135 tablet 4   Galcanezumab-gnlm (EMGALITY) 120 MG/ML SOAJ Inject 120 mg into the skin every 30 days. 1 mL 3   SUMAtriptan (IMITREX) 100 MG tablet Take 1 tablet (100 mg total) by mouth every 2 (two) hours as needed for migraine. May repeat in 2 hours if headache persists or recurs. 10 tablet 4    Galcanezumab-gnlm (EMGALITY) 120 MG/ML SOAJ Inject 120 mg into the skin every 30 (thirty) days. (Patient not taking: Reported on 11/28/2022) 3 mL 3   No facility-administered medications prior to visit.    PAST MEDICAL HISTORY: Past Medical History:  Diagnosis Date   Headache(784.0)    migraines   Hyperemesis    Infertility, female    clomid    PAST SURGICAL HISTORY: Past Surgical History:  Procedure Laterality Date   WISDOM TOOTH EXTRACTION      FAMILY HISTORY: Family History  Problem Relation Age of Onset   Cancer Paternal Grandmother        breast   Hypertension Mother    Deep vein thrombosis Mother    Hypertension Father    CAD Father        CABG   Migraines Father    Diabetes Maternal Aunt    Irritable bowel syndrome Maternal Aunt    Diabetes Maternal Uncle    Lupus Paternal Uncle    Heart disease Maternal  Grandmother    Diabetes Maternal Grandmother    Stroke Maternal Grandmother    Heart disease Maternal Grandfather    Diabetes Maternal Grandfather    Heart disease Paternal Grandfather    Migraines Sister     SOCIAL HISTORY: Social History   Socioeconomic History   Marital status: Married    Spouse name: Not on file   Number of children: 2   Years of education: Not on file   Highest education level: Associate degree: academic program  Occupational History   Not on file  Tobacco Use   Smoking status: Never   Smokeless tobacco: Never  Vaping Use   Vaping status: Not on file  Substance and Sexual Activity   Alcohol use: Never   Drug use: Never   Sexual activity: Yes    Partners: Male    Comment: husband had vasectomy  Other Topics Concern   Not on file  Social History Narrative   Lives at home with husband & children   Right handed   Caffeine: coffee twice daily (2 cups)   Social Determinants of Health   Financial Resource Strain: Not on file  Food Insecurity: Not on file  Transportation Needs: Not on file  Physical Activity: Not on  file  Stress: Not on file  Social Connections: Not on file  Intimate Partner Violence: Not on file      PHYSICAL EXAM  Vitals:   11/28/22 1306  BP: (!) 104/58  Pulse: 73  Weight: 146 lb (66.2 kg)  Height: 5\' 4"  (1.626 m)    Body mass index is 25.06 kg/m.  Generalized: Well developed, in no acute distress  Cardiology: normal rate and rhythm, no murmur noted Respiratory: clear to auscultation bilaterally  Neurological examination  Mentation: Alert oriented to time, place, history taking. Follows all commands speech and language fluent Cranial nerve II-XII: Pupils were equal round reactive to light. Extraocular movements were full, visual field were full on confrontational test. Facial sensation and strength were normal. Uvula tongue midline. Head turning and shoulder shrug  were normal and symmetric. Motor: The motor testing reveals 5 over 5 strength of all 4 extremities. Good symmetric motor tone is noted throughout.  Sensory: Sensory testing is intact to soft touch on all 4 extremities. No evidence of extinction is noted.  Coordination: Cerebellar testing reveals good finger-nose-finger and heel-to-shin bilaterally.  Gait and station: Gait is normal.    DIAGNOSTIC DATA (LABS, IMAGING, TESTING) - I reviewed patient records, labs, notes, testing and imaging myself where available.      No data to display           Lab Results  Component Value Date   WBC 14.5 (H) 12/20/2011   HGB 9.4 (L) 12/20/2011   HCT 27.3 (L) 12/20/2011   MCV 89.5 12/20/2011   PLT 149 (L) 12/20/2011      Component Value Date/Time   PROT 6.5 03/28/2007 0907   ALBUMIN 3.9 03/28/2007 0907   AST 18 03/28/2007 0907   ALT 14 03/28/2007 0907   ALKPHOS 38 (L) 03/28/2007 0907   BILITOT 0.7 03/28/2007 0907   No results found for: "CHOL", "HDL", "LDLCALC", "LDLDIRECT", "TRIG", "CHOLHDL" No results found for: "HGBA1C" No results found for: "VITAMINB12" No results found for: "TSH"    ASSESSMENT  AND PLAN 42 y.o. year old female  has a past medical history of Headache(784.0), Hyperemesis, and Infertility, female. here with     ICD-10-CM   1. Migraine with aura and without status migrainosus,  not intractable  G43.109        Anarose feels migraines are fairly stable but not sure Emgality is as effective as it used to be.   We will discontinue Emgality.  I will start Ajovy every 30 days.  She was advised on appropriate administration and storage of this medication.  Side effects discussed.  She will continue topiramate 200mg  at bedtime for now but may consider reducing dose to 100mg  at bedtime if doing well on Ajovy. Continue Imitrex as needed for abortive therapy.  She was advised to avoid regular use of any abortive or over-the-counter analgesic. We could consider Qulipta, Vyepti or Botox if needed. She will follow-up in 6 months.  Advised that if she does well with Ajovy we may push follow-up out to 1 year.  She verbalizes understanding and agreement with this plan.   No orders of the defined types were placed in this encounter.    Meds ordered this encounter  Medications   Fremanezumab-vfrm (AJOVY) 225 MG/1.5ML SOAJ    Sig: Inject 225 mg into the skin every 30 (thirty) days.    Dispense:  4.5 mL    Refill:  3    Order Specific Question:   Supervising Provider    Answer:   Anson Fret [[id number]]   SUMAtriptan (IMITREX) 100 MG tablet    Sig: Take 1 tablet (100 mg total) by mouth every 2 (two) hours as needed for migraine. May repeat in 2 hours if headache persists or recurs.    Dispense:  10 tablet    Refill:  11    Order Specific Question:   Supervising Provider    Answer:   Anson Fret [id number]     I spent 30 minutes of face-to-face and non-face-to-face time with patient.  This included previsit chart review, lab review, study review, order entry, electronic health record documentation, patient education.      Shawnie Dapper, FNP-C 11/28/2022, 1:50 PM [facility] [address] [place], [place] [address] [phone]

## 2022-11-28 ENCOUNTER — Encounter: Payer: Self-pay | Admitting: Family Medicine

## 2022-11-28 ENCOUNTER — Ambulatory Visit: Payer: BC Managed Care – PPO | Admitting: Family Medicine

## 2022-11-28 VITALS — BP 104/58 | HR 73 | Ht 64.0 in | Wt 146.0 lb

## 2022-11-28 DIAGNOSIS — G43109 Migraine with aura, not intractable, without status migrainosus: Secondary | ICD-10-CM

## 2022-11-28 MED ORDER — SUMATRIPTAN SUCCINATE 100 MG PO TABS: 100.0000 mg | ORAL_TABLET | ORAL | 11 refills | Status: DC | PRN

## 2022-11-28 MED ORDER — AJOVY 225 MG/1.5ML ~~LOC~~ SOAJ: 225.0000 mg | SUBCUTANEOUS | 3 refills | Status: DC

## 2022-12-22 ENCOUNTER — Telehealth: Payer: Self-pay

## 2022-12-22 NOTE — Telephone Encounter (Signed)
*  [facility]  Pharmacy Patient Advocate Encounter   Received notification from CoverMyMeds that prior authorization for AJOVY (fremanezumab-vfrm) injection 225MG /1.5ML auto-injectors  is required/requested.   Insurance verification completed.   The patient is insured through [facility] .   Per test claim: PA required; PA started via CoverMyMeds. KEY [id number] . Waiting for clinical questions to populate.

## 2022-12-30 NOTE — Telephone Encounter (Signed)
Pharmacy Patient Advocate Encounter  Received notification from [facility] that Prior Authorization for Ajovy 225mg  injection has been APPROVED from 12/23/2022 to 03/16/2023

## 2023-02-05 ENCOUNTER — Ambulatory Visit (HOSPITAL_BASED_OUTPATIENT_CLINIC_OR_DEPARTMENT_OTHER)
Admission: EM | Admit: 2023-02-05 | Discharge: 2023-02-05 | Disposition: A | Discharge status: Home / Self Care | Payer: BC Managed Care – PPO | Attending: Internal Medicine | Admitting: Internal Medicine

## 2023-02-05 ENCOUNTER — Encounter (HOSPITAL_BASED_OUTPATIENT_CLINIC_OR_DEPARTMENT_OTHER): Payer: Self-pay | Admitting: Emergency Medicine

## 2023-02-05 DIAGNOSIS — B349 Viral infection, unspecified: Secondary | ICD-10-CM | POA: Diagnosis not present

## 2023-02-05 LAB — POCT INFLUENZA A/B
Influenza A, POC: NEGATIVE
Influenza B, POC: NEGATIVE

## 2023-02-05 NOTE — Discharge Instructions (Signed)
Over the counter medications for symptoms Follow up for new, worsening symptoms or concerns

## 2023-02-05 NOTE — ED Provider Notes (Signed)
[facility]    CSN: [id number] Arrival date & time: 02/05/23  1109      History   Chief Complaint No chief complaint on file.   HPI Carolyn Cervantes is a 42 y.o. female.   The history is provided by the patient.  Not feeling well for 3 days, has had nasal congestion, rhinorrhea, body aches, fatigue, fever, cough Denies chest pain, shortness of breath, nausea, vomiting, diarrhea, dizziness or lightheadedness, rashes or skin changes.  Denies known contacts with illness.  Past Medical History:  Diagnosis Date   Headache(784.0)    migraines   Hyperemesis    Infertility, female    clomid    Patient Active Problem List   Diagnosis Date Noted   Chronic venous insufficiency 03/02/2021   Chronic migraine without aura without status migrainosus, not intractable 01/31/2018    Past Surgical History:  Procedure Laterality Date   WISDOM TOOTH EXTRACTION      OB History     Gravida  4   Para  2   Term  2   Preterm      AB  2   Living  2      SAB  2   IAB      Ectopic      Multiple      Live Births  2            Home Medications    Prior to Admission medications   Medication Sig Start Date End Date Taking? Authorizing Provider  SUMAtriptan (IMITREX) 100 MG tablet Take 1 tablet (100 mg total) by mouth every 2 (two) hours as needed for migraine. May repeat in 2 hours if headache persists or recurs. 11/28/22  Yes Lomax, Amy, NP  topiramate (TOPAMAX) 100 MG tablet Take 1.5 tablets (150 mg total) by mouth at bedtime. 07/19/22  Yes Lomax, Amy, NP  cetirizine (ZYRTEC) 10 MG tablet Take 10 mg by mouth daily as needed for allergies.    [provider]  Fremanezumab-vfrm (AJOVY) 225 MG/1.5ML SOAJ Inject 225 mg into the skin every 30 (thirty) days. 11/28/22   Lomax, Amy, NP    Family History Family History  Problem Relation Age of Onset   Cancer Paternal Grandmother        breast   Hypertension Mother    Deep vein thrombosis Mother     Hypertension Father    CAD Father        CABG   Migraines Father    Diabetes Maternal Aunt    Irritable bowel syndrome Maternal Aunt    Diabetes Maternal Uncle    Lupus Paternal Uncle    Heart disease Maternal Grandmother    Diabetes Maternal Grandmother    Stroke Maternal Grandmother    Heart disease Maternal Grandfather    Diabetes Maternal Grandfather    Heart disease Paternal Grandfather    Migraines Sister     Social History Social History   Tobacco Use   Smoking status: Never   Smokeless tobacco: Never  Substance Use Topics   Alcohol use: Never   Drug use: Never     Allergies   Codeine   Review of Systems Review of Systems  Constitutional:  Positive for fatigue and fever. Negative for chills.  HENT:  Positive for congestion and rhinorrhea. Negative for sore throat and trouble swallowing.   Respiratory:  Positive for cough. Negative for shortness of breath.   Cardiovascular:  Negative for chest pain.     Physical  Exam Triage Vital Signs ED Triage Vitals  Encounter Vitals Group     BP 02/05/23 1206 108/76     Systolic BP Percentile --      Diastolic BP Percentile --      Pulse Rate 02/05/23 1206 67     Resp 02/05/23 1206 18     Temp 02/05/23 1206 98.8 F (37.1 C)     Temp Source 02/05/23 1206 Oral     SpO2 02/05/23 1206 97 %     Weight --      Height --      Head Circumference --      Peak Flow --      Pain Score 02/05/23 1205 0     Pain Loc --      Pain Education --      Exclude from Growth Chart --    No data found.  Updated Vital Signs BP 108/76 (BP Location: Right Arm)   Pulse 67   Temp 98.8 F (37.1 C) (Oral)   Resp 18   LMP 01/30/2023 (Approximate)   SpO2 97%   Visual Acuity Right Eye Distance:   Left Eye Distance:   Bilateral Distance:    Right Eye Near:   Left Eye Near:    Bilateral Near:     Physical Exam Vitals and nursing note reviewed.  Constitutional:      Appearance: She is not ill-appearing.  HENT:     Head:  Normocephalic and atraumatic.     Right Ear: Tympanic membrane and ear canal normal.     Left Ear: Tympanic membrane and ear canal normal.     Nose: Congestion present. No rhinorrhea.     Mouth/Throat:     Mouth: Mucous membranes are moist.     Pharynx: Oropharynx is clear. No oropharyngeal exudate or posterior oropharyngeal erythema.  Eyes:     Conjunctiva/sclera: Conjunctivae normal.  Cardiovascular:     Rate and Rhythm: Normal rate.     Heart sounds: Normal heart sounds.  Pulmonary:     Effort: Pulmonary effort is normal. No respiratory distress.     Breath sounds: Normal breath sounds. No wheezing or rales.  Musculoskeletal:     Cervical back: Neck supple.  Lymphadenopathy:     Cervical: No cervical adenopathy.  Skin:    General: Skin is warm and dry.  Neurological:     Mental Status: She is alert and oriented to person, place, and time.      UC Treatments / Results  Labs (all labs ordered are listed, but only abnormal results are displayed) Labs Reviewed  POCT INFLUENZA A/B    EKG   Radiology No results found.  Procedures Procedures (including critical care time)  Medications Ordered in UC Medications - No data to display  Initial Impression / Assessment and Plan / UC Course  I have reviewed the triage vital signs and the nursing notes.  Pertinent labs & imaging results that were available during my care of the patient were reviewed by me and considered in my medical decision making (see chart for details).    42 year old with cough nasal congestion, body aches, fatigue and fever for 3 days.  No known COVID or flu exposures.  Denies chest pain or shortness of breath.  Vital signs are stable, she is afebrile, nontoxic-appearing, has mild nasal congestion on exam otherwise exam is normal, no tachycardia, tachypnea or abnormal lung sounds.  Point-of-care flu test is negative Discussed with patient likely viral illness recommend OTC  meds for symptomatic relief,  warning signs and follow-up reviewed with patient  Final Clinical Impressions(s) / UC Diagnoses   Final diagnoses:  None   Discharge Instructions   None    ED Prescriptions   None    PDMP not reviewed this encounter.   Meliton Rattan, [place] 02/05/23 1232

## 2023-02-05 NOTE — ED Triage Notes (Signed)
Pt c/o cough, body aches, fatigue x 3 days.

## 2023-02-07 DIAGNOSIS — J3489 Other specified disorders of nose and nasal sinuses: Secondary | ICD-10-CM | POA: Diagnosis not present

## 2023-02-07 DIAGNOSIS — R0981 Nasal congestion: Secondary | ICD-10-CM | POA: Diagnosis not present

## 2023-02-07 DIAGNOSIS — R051 Acute cough: Secondary | ICD-10-CM | POA: Diagnosis not present

## 2023-06-08 DIAGNOSIS — Z1331 Encounter for screening for depression: Secondary | ICD-10-CM | POA: Diagnosis not present

## 2023-06-08 DIAGNOSIS — Z0000 Encounter for general adult medical examination without abnormal findings: Secondary | ICD-10-CM | POA: Diagnosis not present

## 2023-06-08 DIAGNOSIS — Z6824 Body mass index (BMI) 24.0-24.9, adult: Secondary | ICD-10-CM | POA: Diagnosis not present

## 2023-07-18 DIAGNOSIS — N390 Urinary tract infection, site not specified: Secondary | ICD-10-CM | POA: Diagnosis not present

## 2023-07-31 DIAGNOSIS — Z1231 Encounter for screening mammogram for malignant neoplasm of breast: Secondary | ICD-10-CM | POA: Diagnosis not present

## 2023-07-31 DIAGNOSIS — Z01419 Encounter for gynecological examination (general) (routine) without abnormal findings: Secondary | ICD-10-CM | POA: Diagnosis not present

## 2023-07-31 DIAGNOSIS — Z124 Encounter for screening for malignant neoplasm of cervix: Secondary | ICD-10-CM | POA: Diagnosis not present

## 2023-07-31 DIAGNOSIS — Z6823 Body mass index (BMI) 23.0-23.9, adult: Secondary | ICD-10-CM | POA: Diagnosis not present

## 2023-09-08 DIAGNOSIS — J069 Acute upper respiratory infection, unspecified: Secondary | ICD-10-CM | POA: Diagnosis not present

## 2023-11-28 NOTE — Progress Notes (Unsigned)
 PATIENT: Carolyn Cervantes DOB: 05/31/1980  REASON FOR VISIT: follow up HISTORY FROM: patient  No chief complaint on file.    HISTORY OF PRESENT ILLNESS:  11/28/23 ALL:  Carolyn Cervantes returns for follow up for migraines. She was last seen 11/2022 and had continued Emgality , topiramate  and sumatriptan . She did not feel Emgality  was as effective and we switched her to Ajovy . Since,   Aura Estrogen   11/28/2022 ALL:  Carolyn Cervantes returns for follow up for migraines. She was last seen 04/2021 and doing well on Emgality , topiramate  100mg  at bedtime and sumatriptan  PRN. PCP increased topiramate  to 200mg  daily about 2 months ago. Since, she reports doing fairly well. She reports having about 4 migraines per month. She is unsure increased dose of topiramate  has been helpful. Sumatriptan  works well. She rarely has to repeat dose. Menstrual cycles last two days but migraines are more intense during this time. She has had more stressors, recently. She is followed closely by PCP. Recently seen by GYN and ophthalmology with normal eval.   Tried and Failed: Emgality  (on now), Amovig (ineffective), topiramate , propranolol, relpax, sumatriptan , rizatriptan, amitriptyline, nortriptyline, nsaids, tylenol   Ajovy  sample provided in office.   04/13/21 ALL (mychart): Carolyn Cervantes returns for follow up for migraines. She continues to do well on Emgality  monthly and topiramate  100mg  QHS. Sumatriptan  works well for abortive therapy. She feels that migraines are fairly well managed. Naproxen helps with menstrual migraines. She is having 3-4 migraines per month but reports taking sumatriptan  more often to prevent a migraine.   03/12/2020 ALL (mychart):  Carolyn Cervantes is a 43 y.o. female here today for follow up for migraines.  She continues Emgality  and topiramate  100 mg at bedtime.  Sumatriptan  continues to be helpful for abortive therapy.  She may go 3 to 4 weeks with no migraines at all.  She does very well if she can stay on a  consistent sleep cycle and maintain diet.  She does endorse significant migraines around the time of her cycle.  She does have a regular cycle and is able to track this.  She is doing very well today and without concerns.  02/20/2019 ALL:  Carolyn Cervantes is a 43 y.o. female here today for follow up for migraines. She was seen by Dr Ines last in 01/2018. She was started on Amovig. She tried to taper topiramate  dose but headaches worsened. She continues topiramate  100mg  at bedtime. She reports doing very well in the first half of the year. Over the past few months, she feels that headaches are worsening. They are no were as bad as they were but seem to be occurring more frequently. She may have 2-5 migrainous 4:00 in the morning and at days per month. Usually unilateral sharp shooting pain. Sometimes pounding. Her eye will tear up. Rest and sleep help. She always has a bad migraine at the start of her menstrual cycle. She has noted some flashing lights before certain migraines. Imitrex  does help. She was advised about a month ago by dermatology to stop ibuprofen  just to be safe due to idiopathic urticaria. She is treated for seasonal allergies with daily cetirizine and pseudoephedrine as needed.   Failed Topiramate , relpax and other triptans, amitriptyline, nortriptyline, nsaids,tylenol , propranolol  HISTORY: (copied from Dr Sharion note on 01/25/2018)  HPI:  Carolyn Cervantes is a 43 y.o. female here as requested by Dr. Jefferey for migraines.  Past medical history migraines. Migraines started in HS. She [facility].  She did pharm studies in college. Usually on right side beind the temples pounding/ pulsating, throbbing, nausea, light and sound sensitivity, +nausea and recent vomiting, increasing frequency. No med overuse. No aura.  Can last 24-72 hours. 16 migraine days a month. She is also irritable. Topamax  side effects. Ongoing for over a year. She has had vision changes. She has  significant eye pain as well. Ophthalmology said her eyes were fine. She can wake with headaches, can improve after getting up in the morning.No other focal neurologic deficits, associated symptoms, inciting events or modifiable factors.   Reviewed notes, labs and imaging from outside physicians, which showed:   Reviewed notes from Dr. Lance.  Patient reports that with the increase in Topamax  initially felt like it helped but it is like the benefit is worn off.  Her eyes have started to hurt.  She went to an eye specialist for checkup and everything was okay.  She has to take the Imitrex  about 2 times per week.  If she does not get ahead of the migraine she will have a headache for 2 days.  To get rid of the headache she will take an Imitrex  using a pack lay down drink caffeine.  Carolyn Cervantes has a headache in the right temple area.  The headache will often wake her up at like 3 AM.  She does work night shift.  Reviewed exam which was normal.  Never smoker.  Overall very healthy female.  No significant past medical history.   REVIEW OF SYSTEMS: Out of a complete 14 system review of symptoms, the patient complains only of the following symptoms, headaches and all other reviewed systems are negative.  ALLERGIES: Allergies  Allergen Reactions   Codeine Anaphylaxis    Pt reports tolerating ultram  in the past.    HOME MEDICATIONS: Outpatient Medications Prior to Visit  Medication Sig Dispense Refill   cetirizine (ZYRTEC) 10 MG tablet Take 10 mg by mouth daily as needed for allergies.     Fremanezumab -vfrm (AJOVY ) 225 MG/1.5ML SOAJ Inject 225 mg into the skin every 30 (thirty) days. 4.5 mL 3   SUMAtriptan  (IMITREX ) 100 MG tablet Take 1 tablet (100 mg total) by mouth every 2 (two) hours as needed for migraine. May repeat in 2 hours if headache persists or recurs. 10 tablet 11   topiramate  (TOPAMAX ) 100 MG tablet Take 1.5 tablets (150 mg total) by mouth at bedtime. 135 tablet 4   No facility-administered  medications prior to visit.    PAST MEDICAL HISTORY: Past Medical History:  Diagnosis Date   Headache(784.0)    migraines   Hyperemesis    Infertility, female    clomid    PAST SURGICAL HISTORY: Past Surgical History:  Procedure Laterality Date   WISDOM TOOTH EXTRACTION      FAMILY HISTORY: Family History  Problem Relation Age of Onset   Cancer Paternal Grandmother        breast   Hypertension Mother    Deep vein thrombosis Mother    Hypertension Father    CAD Father        CABG   Migraines Father    Diabetes Maternal Aunt    Irritable bowel syndrome Maternal Aunt    Diabetes Maternal Uncle    Lupus Paternal Uncle    Heart disease Maternal Grandmother    Diabetes Maternal Grandmother    Stroke Maternal Grandmother    Heart disease Maternal Grandfather    Diabetes Maternal Grandfather    Heart disease Paternal Grandfather  Migraines Sister     SOCIAL HISTORY: Social History   Socioeconomic History   Marital status: Married    Spouse name: Not on file   Number of children: 2   Years of education: Not on file   Highest education level: Associate degree: academic program  Occupational History   Not on file  Tobacco Use   Smoking status: Never   Smokeless tobacco: Never  Vaping Use   Vaping status: Not on file  Substance and Sexual Activity   Alcohol use: Never   Drug use: Never   Sexual activity: Yes    Partners: Male    Comment: husband had vasectomy  Other Topics Concern   Not on file  Social History Narrative   Lives at home with husband & children   Right handed   Caffeine: coffee twice daily (2 cups)   Social Drivers of Corporate investment banker Strain: Not on file  Food Insecurity: Not on file  Transportation Needs: Not on file  Physical Activity: Not on file  Stress: Not on file  Social Connections: Not on file  Intimate Partner Violence: Not on file      PHYSICAL EXAM  There were no vitals filed for this visit.   There  is no height or weight on file to calculate BMI.  Generalized: Well developed, in no acute distress  Cardiology: normal rate and rhythm, no murmur noted Respiratory: clear to auscultation bilaterally  Neurological examination  Mentation: Alert oriented to time, place, history taking. Follows all commands speech and language fluent Cranial nerve II-XII: Pupils were equal round reactive to light. Extraocular movements were full, visual field were full on confrontational test. Facial sensation and strength were normal. Uvula tongue midline. Head turning and shoulder shrug  were normal and symmetric. Motor: The motor testing reveals 5 over 5 strength of all 4 extremities. Good symmetric motor tone is noted throughout.  Sensory: Sensory testing is intact to soft touch on all 4 extremities. No evidence of extinction is noted.  Coordination: Cerebellar testing reveals good finger-nose-finger and heel-to-shin bilaterally.  Gait and station: Gait is normal.    DIAGNOSTIC DATA (LABS, IMAGING, TESTING) - I reviewed patient records, labs, notes, testing and imaging myself where available.      No data to display           Lab Results  Component Value Date   WBC 14.5 (H) 12/20/2011   HGB 9.4 (L) 12/20/2011   HCT 27.3 (L) 12/20/2011   MCV 89.5 12/20/2011   PLT 149 (L) 12/20/2011      Component Value Date/Time   PROT 6.5 03/28/2007 0907   ALBUMIN 3.9 03/28/2007 0907   AST 18 03/28/2007 0907   ALT 14 03/28/2007 0907   ALKPHOS 38 (L) 03/28/2007 0907   BILITOT 0.7 03/28/2007 0907   No results found for: CHOL, HDL, LDLCALC, LDLDIRECT, TRIG, CHOLHDL No results found for: YHAJ8R No results found for: VITAMINB12 No results found for: TSH    ASSESSMENT AND PLAN 43 y.o. year old female  has a past medical history of Headache(784.0), Hyperemesis, and Infertility, female. here with   No diagnosis found.    Towanna feels migraines are improved. She will continue Ajovy   injections every month. She will continue topiramate  150mg  daily and Imitrex  as needed for abortive therapy.  She was advised to avoid regular use of any abortive or over-the-counter analgesic. We could consider Qulipta, Vyepti or Botox if needed. Advised to avoid use of estrogen with  history of migraine with aura. She will follow-up in 1 year. She verbalizes understanding and agreement with this plan.   No orders of the defined types were placed in this encounter.    No orders of the defined types were placed in this encounter.    I spent 30 minutes of face-to-face and non-face-to-face time with patient.  This included previsit chart review, lab review, study review, order entry, electronic health record documentation, patient education.      Greig Forbes, FNP-C 11/28/2023, 9:51 AM [facility] [address] [place], [place] [address] [phone]

## 2023-11-28 NOTE — Patient Instructions (Signed)
 Below is our plan:  We will continue topiramate  25-50mg  daily and sumatriptan  as needed  Please make sure you are staying well hydrated. I recommend 50-60 ounces daily. Well balanced diet and regular exercise encouraged. Consistent sleep schedule with 6-8 hours recommended.   Please continue follow up with care team as directed.   Follow up with me in 1 year   You may receive a survey regarding today's visit. I encourage you to leave honest feed back as I do use this information to improve patient care. Thank you for seeing me today!   GENERAL HEADACHE INFORMATION:   Natural supplements: Magnesium Oxide or Magnesium Glycinate 500 mg at bed (up to 800 mg daily) Coenzyme Q10 300 mg in AM Vitamin B2- 200 mg twice a day   Add 1 supplement at a time since even natural supplements can have undesirable side effects. You can sometimes buy supplements cheaper (especially Coenzyme Q10) at www.WebmailGuide.co.za or at [facility].  Migraine with aura: There is increased risk for stroke in women with migraine with aura and a contraindication for the combined contraceptive pill for use by women who have migraine with aura. The risk for women with migraine without aura is lower. However other risk factors like smoking are far more likely to increase stroke risk than migraine. There is a recommendation for no smoking and for the use of OCPs without estrogen such as progestogen only pills particularly for women with migraine with aura.SABRA People who have migraine headaches with auras may be 3 times more likely to have a stroke caused by a blood clot, compared to migraine patients who don't see auras. Women who take hormone-replacement therapy may be 30 percent more likely to suffer a clot-based stroke than women not taking medication containing estrogen. Other risk factors like smoking and high blood pressure may be  much more important.    Vitamins and herbs that show potential:   Magnesium: Magnesium (250 mg twice a  day or 500 mg at bed) has a relaxant effect on smooth muscles such as blood vessels. Individuals suffering from frequent or daily headache usually have low magnesium levels which can be increase with daily supplementation of 400-750 mg. Three trials found 40-90% average headache reduction  when used as a preventative. Magnesium may help with headaches are aura, the best evidence for magnesium is for migraine with aura is its thought to stop the cortical spreading depression we believe is the pathophysiology of migraine aura.Magnesium also demonstrated the benefit in menstrually related migraine.  Magnesium is part of the messenger system in the serotonin cascade and it is a good muscle relaxant.  It is also useful for constipation which can be a side effect of other medications used to treat migraine. Good sources include nuts, whole grains, and tomatoes. Side Effects: loose stool/diarrhea  Riboflavin (vitamin B 2) 200 mg twice a day. This vitamin assists nerve cells in the production of ATP a principal energy storing molecule.  It is necessary for many chemical reactions in the body.  There have been at least 3 clinical trials of riboflavin using 400 mg per day all of which suggested that migraine frequency can be decreased.  All 3 trials showed significant improvement in over half of migraine sufferers.  The supplement is found in bread, cereal, milk, meat, and poultry.  Most Americans get more riboflavin than the recommended daily allowance, however riboflavin deficiency is not necessary for the supplements to help prevent headache. Side effects: energizing, green urine   Coenzyme  Q10: This is present in almost all cells in the body and is critical component for the conversion of energy.  Recent studies have shown that a nutritional supplement of CoQ10 can reduce the frequency of migraine attacks by improving the energy production of cells as with riboflavin.  Doses of 150 mg twice a day have been shown to be  effective.   Melatonin: Increasing evidence shows correlation between melatonin secretion and headache conditions.  Melatonin supplementation has decreased headache intensity and duration.  It is widely used as a sleep aid.  Sleep is natures way of dealing with migraine.  A dose of 3 mg is recommended to start for headaches including cluster headache. Higher doses up to 15 mg has been reviewed for use in Cluster headache and have been used. The rationale behind using melatonin for cluster is that many theories regarding the cause of Cluster headache center around the disruption of the normal circadian rhythm in the brain.  This helps restore the normal circadian rhythm.   HEADACHE DIET: Foods and beverages which may trigger migraine Note that only 20% of headache patients are food sensitive. You will know if you are food sensitive if you get a headache consistently 20 minutes to 2 hours after eating a certain food. Only cut out a food if it causes headaches, otherwise you might remove foods you enjoy! What matters most for diet is to eat a well balanced healthy diet full of vegetables and low fat protein, and to not miss meals.   Chocolate, other sweets ALL cheeses except cottage and cream cheese Dairy products, yogurt, sour cream, ice cream Liver Meat extracts (Bovril, Marmite, meat tenderizers) Meats or fish which have undergone aging, fermenting, pickling or smoking. These include: Hotdogs,salami,Lox,sausage, mortadellas,smoked salmon, pepperoni, Pickled herring Pods of broad bean (English beans, Chinese pea pods, [place] (fava) beans, lima and navy beans Ripe avocado, ripe banana Yeast extracts or active yeast preparations such as Brewer's or Fleishman's (commercial bakes goods are permitted) Tomato based foods, pizza (lasagna, etc.)   MSG (monosodium glutamate) is disguised as many things; look for these common aliases: Monopotassium glutamate Autolysed yeast Hydrolysed protein Sodium  caseinate "flavorings" "all natural preservatives Nutrasweet   Avoid all other foods that convincingly provoke headaches.   Resources: The Dizzy Bluford Aid Your Headache Diet, migrainestrong.com  https://zamora-andrews.com/   Caffeine and Migraine For patients that have migraine, caffeine intake more than 3 days per week can lead to dependency and increased migraine frequency. I would recommend cutting back on your caffeine intake as best you can. The recommended amount of caffeine is 200-300 mg daily, although migraine patients may experience dependency at even lower doses. While you may notice an increase in headache temporarily, cutting back will be helpful for headaches in the long run. For more information on caffeine and migraine, visit: https://americanmigrainefoundation.org/resource-library/caffeine-and-migraine/   Headache Prevention Strategies:   1. Maintain a headache diary; learn to identify and avoid triggers.  - This can be a simple note where you log when you had a headache, associated symptoms, and medications used - There are several smartphone apps developed to help track migraines: Migraine Buddy, Migraine Monitor, Curelator N1-Headache App   Common triggers include: Emotional triggers: Emotional/Upset family or friends Emotional/Upset occupation Business reversal/success Anticipation anxiety Crisis-serious Post-crisis periodNew job/position   Physical triggers: Vacation Day Weekend Strenuous Exercise High Altitude Location New Move Menstrual Day Physical Illness Oversleep/Not enough sleep Weather changes Light: Photophobia or light sesnitivity treatment involves a balance between desensitization and reduction in  overly strong input. Use dark polarized glasses outside, but not inside. Avoid bright or fluorescent light, but do not dim environment to the point that going into a normally lit room hurts. Consider  FL-41 tint lenses, which reduce the most irritating wavelengths without blocking too much light.  These can be obtained at axonoptics.com or theraspecs.com Foods: see list above.   2. Limit use of acute treatments (over-the-counter medications, triptans, etc.) to no more than 2 days per week or 10 days per month to prevent medication overuse headache (rebound headache).     3. Follow a regular schedule (including weekends and holidays): Don't skip meals. Eat a balanced diet. 8 hours of sleep nightly. Minimize stress. Exercise 30 minutes per day. Being overweight is associated with a 5 times increased risk of chronic migraine. Keep well hydrated and drink 6-8 glasses of water per day.   4. Initiate non-pharmacologic measures at the earliest onset of your headache. Rest and quiet environment. Relax and reduce stress. Breathe2Relax is a free app that can instruct you on    some simple relaxtion and breathing techniques. Http://Dawnbuse.com is a    free website that provides teaching videos on relaxation.  Also, there are  many apps that   can be downloaded for "mindful" relaxation.  An app called YOGA NIDRA will help walk you through mindfulness. Another app called Calm can be downloaded to give you a structured mindfulness guide with daily reminders and skill development. Headspace for guided meditation Mindfulness Based Stress Reduction Online Course: www.palousemindfulness.com Cold compresses.   5. Don't wait!! Take the maximum allowable dosage of prescribed medication at the first sign of migraine.   6. Compliance:  Take prescribed medication regularly as directed and at the first sign of a migraine.   7. Communicate:  Call your physician when problems arise, especially if your headaches change, increase in frequency/severity, or become associated with neurological symptoms (weakness, numbness, slurred speech, etc.). Proceed to emergency room if you experience new or worsening symptoms or  symptoms do not resolve, if you have new neurologic symptoms or if headache is severe, or for any concerning symptom.   8. Headache/pain management therapies: Consider various complementary methods, including medication, behavioral therapy, psychological counselling, biofeedback, massage therapy, acupuncture, dry needling, and other modalities.  Such measures may reduce the need for medications. Counseling for pain management, where patients learn to function and ignore/minimize their pain, seems to work very well.   9. Recommend changing family's attention and focus away from patient's headaches. Instead, emphasize daily activities. If first question of day is 'How are your headaches/Do you have a headache today?', then patient will constantly think about headaches, thus making them worse. Goal is to re-direct attention away from headaches, toward daily activities and other distractions.   10. Helpful Websites: www.AmericanHeadacheSociety.org PatentHood.ch www.headaches.org TightMarket.nl www.achenet.org

## 2023-11-29 ENCOUNTER — Encounter: Payer: Self-pay | Admitting: Family Medicine

## 2023-11-29 ENCOUNTER — Ambulatory Visit: Payer: BC Managed Care – PPO | Admitting: Family Medicine

## 2023-11-29 VITALS — BP 100/68 | HR 65 | Ht 64.0 in | Wt 140.5 lb

## 2023-11-29 DIAGNOSIS — G43109 Migraine with aura, not intractable, without status migrainosus: Secondary | ICD-10-CM

## 2023-11-29 MED ORDER — SUMATRIPTAN SUCCINATE 100 MG PO TABS: 100.0000 mg | ORAL_TABLET | ORAL | 11 refills | Status: AC | PRN

## 2023-11-29 MED ORDER — TOPIRAMATE 25 MG PO TABS: ORAL_TABLET | ORAL | 3 refills | Status: AC

## 2024-01-31 DIAGNOSIS — J0120 Acute ethmoidal sinusitis, unspecified: Secondary | ICD-10-CM | POA: Diagnosis not present

## 2024-12-17 ENCOUNTER — Ambulatory Visit: Admitting: Family Medicine
